# Patient Record
Sex: Male | Born: 1935 | Race: Black or African American | Hispanic: No | Marital: Married | Smoking: Former smoker
Health system: Southern US, Community
[De-identification: ages and names within clinical notes are randomized; demographics above are authoritative.]

## PROBLEM LIST (undated history)

## (undated) DIAGNOSIS — I739 Peripheral vascular disease, unspecified: Secondary | ICD-10-CM

## (undated) DIAGNOSIS — D649 Anemia, unspecified: Secondary | ICD-10-CM

## (undated) DIAGNOSIS — D696 Thrombocytopenia, unspecified: Secondary | ICD-10-CM

## (undated) DIAGNOSIS — I10 Essential (primary) hypertension: Secondary | ICD-10-CM

## (undated) DIAGNOSIS — E119 Type 2 diabetes mellitus without complications: Secondary | ICD-10-CM

## (undated) DIAGNOSIS — E7800 Pure hypercholesterolemia, unspecified: Secondary | ICD-10-CM

## (undated) DIAGNOSIS — K219 Gastro-esophageal reflux disease without esophagitis: Secondary | ICD-10-CM

## (undated) HISTORY — DX: Anemia, unspecified: D64.9

## (undated) HISTORY — PX: OTHER SURGICAL HISTORY: SHX169

## (undated) HISTORY — DX: Type 2 diabetes mellitus without complications: E11.9

## (undated) HISTORY — PX: COLONOSCOPY: SHX174

---

## 2003-07-15 ENCOUNTER — Other Ambulatory Visit: Payer: Self-pay

## 2004-06-26 ENCOUNTER — Ambulatory Visit: Payer: Self-pay | Admitting: General Surgery

## 2005-02-06 ENCOUNTER — Other Ambulatory Visit: Payer: Self-pay

## 2005-02-09 ENCOUNTER — Ambulatory Visit: Payer: Self-pay | Admitting: Orthopedic Surgery

## 2006-04-20 ENCOUNTER — Emergency Department: Payer: Self-pay | Admitting: Emergency Medicine

## 2006-09-12 ENCOUNTER — Ambulatory Visit: Payer: Self-pay | Admitting: Internal Medicine

## 2008-02-13 ENCOUNTER — Ambulatory Visit: Payer: Self-pay | Admitting: Orthopedic Surgery

## 2009-07-25 ENCOUNTER — Ambulatory Visit: Payer: Self-pay | Admitting: Internal Medicine

## 2010-01-12 ENCOUNTER — Ambulatory Visit: Payer: Self-pay | Admitting: Internal Medicine

## 2011-07-26 ENCOUNTER — Inpatient Hospital Stay: Payer: Self-pay | Admitting: Internal Medicine

## 2011-07-26 LAB — CBC
HCT: 37.5 % — ABNORMAL LOW (ref 40.0–52.0)
HGB: 12.3 g/dL — ABNORMAL LOW (ref 13.0–18.0)
MCV: 92 fL (ref 80–100)
RBC: 4.06 10*6/uL — ABNORMAL LOW (ref 4.40–5.90)
WBC: 4.5 10*3/uL (ref 3.8–10.6)

## 2011-07-26 LAB — COMPREHENSIVE METABOLIC PANEL
Alkaline Phosphatase: 94 U/L (ref 50–136)
Anion Gap: 8 (ref 7–16)
BUN: 18 mg/dL (ref 7–18)
Bilirubin,Total: 0.4 mg/dL (ref 0.2–1.0)
Co2: 25 mmol/L (ref 21–32)
Creatinine: 1.22 mg/dL (ref 0.60–1.30)
EGFR (Non-African Amer.): 56 — ABNORMAL LOW
Potassium: 4.5 mmol/L (ref 3.5–5.1)
SGPT (ALT): 30 U/L
Sodium: 141 mmol/L (ref 136–145)

## 2011-07-27 LAB — TSH: Thyroid Stimulating Horm: 0.945 u[IU]/mL

## 2013-04-29 ENCOUNTER — Ambulatory Visit: Payer: Self-pay | Admitting: Internal Medicine

## 2013-10-01 ENCOUNTER — Ambulatory Visit: Payer: Self-pay | Admitting: Internal Medicine

## 2014-04-27 NOTE — Discharge Summary (Signed)
PATIENT NAME:  Scott Hudson, Scott Hudson MR#:  [id number] DATE OF BIRTH:  12/31/1929  DATE OF ADMISSION:  07/26/2011 DATE OF DISCHARGE:  07/27/2011  ADMITTING DIAGNOSIS: Angioedema.   DISCHARGE DIAGNOSES: 1. Angioedema, likely due to lisinopril.  2. Sinus bradycardia. 3. History of hypertension. 4. Hyperlipidemia. 5. Gastroesophageal reflux disease.   DISCHARGE CONDITION: Fair.   DISCHARGE MEDICATIONS: Patient is to resume his outpatient medications which are: 1. Domeboro topical powder for constitution apply topically to affected area 3 times daily.  2. Omeprazole 40 mg p.o. daily.  3. Simvastatin 40 mg at bedtime.   ADDITIONAL MEDICATIONS:  1. Claritin 10 mg p.o. daily. 2. Benadryl 25 mg p.o. every four hours as needed.  3. Prednisone 60 mg p.o. once on 07/28/2011, then taper x 10 mg daily until stopped.  4. Hydralazine 10 mg p.o. 3 times daily. 5. Imdur 30 mg p.o. daily.  6. Patient was advised not to take lisinopril.  HOME OXYGEN: None.   DIET: 2 grams salt, low fat, low cholesterol.  ACTIVITY LIMITATIONS: As tolerated.   FOLLOW UP: Follow-up appointment with Dr. Hall Busing in two days after discharge.    CONSULTANTS: None.   HISTORY OF PRESENT ILLNESS: Patient is a 81 year old African American male with past medical history significant for history of hypertension, hyperlipidemia, gastroesophageal reflux disease who presented to the hospital with upper lip swelling. Please refer to Dr. Graciela Husbands admission note on 07/27/2011. Apparently patient woke up with tingling and numbness sensation in his lips and some mild swelling. Upon presentation to ED patient's lips, mainly upper lip, got progressively much worse despite IV Solu-Medrol as well as Zantac and Benadryl and hospitalist services were admitted for further management and observation. Otherwise patient did not complain of shortness of breath, any other symptoms of swallowing problems or airway occlusion. On arrival to the hospital  patient's temperature 97.8, pulse 48, respiration rate 16, blood pressure 166/82, saturation 98% on room air. Physical examination was unremarkable except of some swelling in his upper lip.  LABORATORY, DIAGNOSTIC AND RADIOLOGICAL DATA: Chest x-ray PA and lateral 07/26/2011 revealed mildly increased interstitial markings noted which could reflect mild interstitial edema. No focal pneumonia. Elevation of left hemidiaphragm was again demonstrated but was described on the chest x-ray as far as 06/03/1998.   Patient's lab data showed normal BMP. Patient's liver enzymes also were normal. Patient's CBC was within normal limits except patient's hemoglobin level was low at 12.3. White blood cell count 4.5 and platelet count 170. Chest x-ray was unremarkable.   HOSPITAL COURSE: Patient was admitted to the hospital for further evaluation and was started on Solu-Medrol as well as Claritin, Benadryl, Zantac. He improved slowly but his improvement was not significant, however, he continued to deny any other symptomatology such as airway occlusion or obstruction, any shortness of breath or discomfort in his mouth. It was felt he was stable to be discharged and he is being discharged today on 07/27/2011. On discharge, patient's vital signs: Temperature 98.3, pulse 66, respiration rate 18, blood pressure 147/65, saturation 94% to 97% on room air at rest. Of note, patient was noted to have sinus bradycardia. TSH was checked, was found to be within normal limits. Patient's bradycardia resolved and patient's heart rate is 60s to 70s on day of discharge. In regards to hypertension, as mentioned above patient's blood pressure medication, lisinopril, was completely discontinued. Patient was started on hydralazine as well as Imdur. It is recommended to advance hydralazine as well as Imdur or change to Norvasc and watch  patient's symptoms as well as blood pressure readings.   For hyperlipidemia, patient is to continue simvastatin.    For gastroesophageal reflux disease patient was continued on omeprazole.   Patient is being discharged in stable condition with above-mentioned medications and follow up.   TIME SPENT: 40 minutes.   ____________________________ Theodoro Grist, MD rv:cms D: 07/27/2011 17:42:05 ET T: 07/30/2011 09:30:05 ET JOB#: [id number]  cc: Theodoro Grist, MD, <Dictator> Leona Carry. Hall Busing, MD Theodoro Grist MD ELECTRONICALLY SIGNED 08/01/2011 16:23

## 2014-04-27 NOTE — H&P (Signed)
PATIENT NAME:  Scott Hudson, Scott Hudson MR#:  [id number] DATE OF BIRTH:  Apr 01, 1929  DATE OF ADMISSION:  07/26/2011  REFERRING PHYSICIAN: Francene Castle, MD  PRIMARY CARE PHYSICIAN: Benita Stabile, MD  CHIEF COMPLAINT: Lip swelling.   HISTORY OF PRESENT ILLNESS: This is a 81 year old male with significant past medical history of gastroesophageal reflux disease, hypertension, and hyperlipidemia who has been on lisinopril for the last three years without any problem. The patient reports this a.m. when he woke up he started to feel some tingling and numbness in his lips with some mild swelling. Upon presentation to the ED, the patient's lips, mainly upper lip, has gotten progressively much worse despite receiving IV Solu-Medrol and IV Zantac and Benadryl and there was no improvement so hospitalist service was requested to admit the patient for further management. The patient denies any shortness of breath, wheezing, any dysphagia, or any increased oral secretions or sputum production. He denies any chest pain, lightheadedness, altered mental status, or syncope.  PAST MEDICAL HISTORY:  1. Gastroesophageal reflux disease. 2. Hypertension.  3. Hyperlipidemia.   PAST SURGICAL HISTORY: History of bunion removal.   FAMILY HISTORY: No history of angioedema. Not significant for diabetes or hypertension.   ALLERGIES: No known drug allergies.   HOME MEDICATIONS:  1. Lisinopril 20 mg oral daily.  2. Omeprazole 40 mg oral daily.  3. Simvastatin 40 mg at bedtime.   SOCIAL HISTORY: The patient quit smoking a few years ago. No history of alcohol or illicit drug use.   REVIEW OF SYSTEMS: CONSTITUTIONAL: Denies any fever, fatigue, or weakness. EYES: Denies blurry vision, double vision, or pain. ENT: Denies tinnitus, ear pain, hearing loss, epistaxis, discharge, snoring, postnasal drip, or sinus pain. Has complaints of lip tingling and swelling. RESPIRATORY: Denies any cough, wheezing, hemoptysis, dyspnea, or asthma.  CARDIOVASCULAR: Denies any chest pain, orthopnea, or edema. GASTROINTESTINAL: Denies nausea, vomiting, diarrhea, abdominal pain, or hematemesis. GENITOURINARY: Denies dysuria, hematuria, or renal colic. ENDOCRINE: Denies polyuria, polydipsia, or heat or cold intolerance. HEMATOLOGY: Denies anemia, easy bruising, or bleeding diathesis. INTEGUMENT: Denies any acne or rash. MUSCULOSKELETAL: Denies any neck pain, shoulder pain, back pain, swelling, joint swelling, or cramps. NEUROLOGIC: Denies any weakness, dysarthria, epilepsy, or tremors. PSYCH: Denies any anxiety, insomnia, or schizophrenia.   PHYSICAL EXAMINATION:   VITAL SIGNS: Temperature 97.8, pulse 48, respiratory rate 16, blood pressure 166/82, and saturation 98% on room air.   GENERAL: Well-nourished male who is comfortable and in no apparent distress.   HEENT: Head atraumatic, normocephalic. Pupils are equal and reactive to light. Pink conjunctivae. Anicteric sclerae. Moist oral mucosa. Bilateral lip swelling, mainly upper lip. Tongue has no swelling. Oropharynx was examined. The patient does not have any swelling or erythema.   NECK: No thyromegaly, no bruits, supple.  LUNGS: Good air entry. No wheezing or rhonchi.   HEART: S1 and S2 heard. No murmurs, rubs, or gallops.  ABDOMEN: Soft, nontender, and nondistended. Bowel sounds present.   EXTREMITIES: No edema, clubbing, or cyanosis.   PSYCHIATRIC: Appropriate affect. Awake, alert, and oriented x3. Intact judgment and insight.   NEUROLOGIC: Cranial nerves grossly intact. Motor 5 out of 5. Sensory intact.   PERTINENT LABS: Glucose 99, BUN 18, creatinine 1.22, sodium 141, potassium 4.5, chloride 108, and CO2 25. White blood cells 4.5, hemoglobin 12.3, hematocrit 37.5, and platelets 170.   ASSESSMENT AND PLAN:  1. Angioedema. This is most likely secondary to lisinopril. We will add ACE and ARB to the patient's allergy list. We will admit him for  observation. We will start the patient  on IV Solu-Medrol 60 mg every eight hours and we will have him on Protonix for GI prophylaxis. As well we will start him on ranitidine and p.r.n. Benadryl. 2. Hypertension. We will hold lisinopril at this point and will resume a different class of medication, possibly Norvasc or beta blocker if blood pressure is uncontrolled. 3. Gastroesophageal reflux disease. We will continue with Protonix.  4. Deep vein thrombosis prophylaxis. Subcutaneous heparin.   CODE STATUS: FULL CODE.   TIME SPENT ON PATIENT CARE ON ADMISSION: 45 minutes.  ____________________________ Albertine Patricia, MD dse:slb D: 07/26/2011 22:32:08 ET T: 07/27/2011 08:45:55 ET JOB#: [id number]  cc: Albertine Patricia, MD, <Dictator> Leona Carry. Hall Busing, MD Demarqus Jocson Graciela Husbands MD ELECTRONICALLY SIGNED 07/27/2011 13:27

## 2014-06-23 ENCOUNTER — Emergency Department
Admission: EM | Admit: 2014-06-23 | Discharge: 2014-06-24 | Disposition: A | Discharge status: Home / Self Care | Payer: Worker's Compensation | Attending: Emergency Medicine | Admitting: Emergency Medicine

## 2014-06-23 ENCOUNTER — Encounter: Payer: Self-pay | Admitting: Emergency Medicine

## 2014-06-23 DIAGNOSIS — I10 Essential (primary) hypertension: Secondary | ICD-10-CM | POA: Diagnosis not present

## 2014-06-23 DIAGNOSIS — Z23 Encounter for immunization: Secondary | ICD-10-CM | POA: Insufficient documentation

## 2014-06-23 DIAGNOSIS — Y9389 Activity, other specified: Secondary | ICD-10-CM | POA: Diagnosis not present

## 2014-06-23 DIAGNOSIS — Y990 Civilian activity done for income or pay: Secondary | ICD-10-CM | POA: Insufficient documentation

## 2014-06-23 DIAGNOSIS — W260XXA Contact with knife, initial encounter: Secondary | ICD-10-CM | POA: Insufficient documentation

## 2014-06-23 DIAGNOSIS — Y9289 Other specified places as the place of occurrence of the external cause: Secondary | ICD-10-CM | POA: Diagnosis not present

## 2014-06-23 DIAGNOSIS — S61512A Laceration without foreign body of left wrist, initial encounter: Secondary | ICD-10-CM | POA: Diagnosis not present

## 2014-06-23 HISTORY — DX: Essential (primary) hypertension: I10

## 2014-06-23 MED ORDER — LIDOCAINE HCL (PF) 1 % IJ SOLN
INTRAMUSCULAR | Status: AC
  Filled 2014-06-23: qty 5

## 2014-06-23 NOTE — ED Provider Notes (Signed)
[facility] Emergency Department Provider Note  ____________________________________________  Time seen: 11:20 PM  I have reviewed the triage vital signs and the nursing notes.   HISTORY  Chief Complaint Laceration      HPI Scott Hudson is a 81 y.o. male presents with accidental laceration to the /wrist. Injury occurred at work apparently patient states he moved his arm and actually it rubbed against a knife. Patient unsure as to when he received his last tetanus shot.     Past Medical History  Diagnosis Date  . Hypertension     There are no active problems to display for this patient.   History reviewed. No pertinent past surgical history.  No current outpatient prescriptions on file.  Allergies Review of patient's allergies indicates not on file.  History reviewed. No pertinent family history.  Social History History  Substance Use Topics  . Smoking status: Never Smoker   . Smokeless tobacco: Not on file  . Alcohol Use: No    Review of Systems  Constitutional: Negative for fever. Eyes: Negative for visual changes. ENT: Negative for sore throat. Cardiovascular: Negative for chest pain. Respiratory: Negative for shortness of breath. Gastrointestinal: Negative for abdominal pain, vomiting and diarrhea. Genitourinary: Negative for dysuria. Musculoskeletal: Negative for back pain. Skin: Positive the laceration of the left wrist Neurological: Negative for headaches, focal weakness or numbness.   10-point ROS otherwise negative.  ____________________________________________   PHYSICAL EXAM:  VITAL SIGNS: ED Triage Vitals  Enc Vitals Group     BP 06/23/14 2323 166/81 mmHg     Pulse Rate 06/23/14 2323 73     Resp --      Temp 06/23/14 2323 97.7 F (36.5 C)     Temp Source 06/23/14 2323 Oral     SpO2 06/23/14 2323 96 %     Weight 06/23/14 2323 160 lb (72.576 kg)     Height 06/23/14 2323 5\' 8"  (1.727 m)     Head Cir  --      Peak Flow --      Pain Score --      Pain Loc --      Pain Edu? --      Excl. in [place]? --      Constitutional: Alert and oriented. Well appearing and in no distress. Eyes: Conjunctivae are normal. PERRL. Normal extraocular movements. ENT   Head: Normocephalic and atraumatic.   Nose: No congestion/rhinnorhea.   Mouth/Throat: Mucous membranes are moist.   Neck: No stridor. Hematological/Lymphatic/Immunilogical: No cervical lymphadenopathy. Cardiovascular: Normal rate, regular rhythm. Normal and symmetric distal pulses are present in all extremities. No murmurs, rubs, or gallops. Respiratory: Normal respiratory effort without tachypnea nor retractions. Breath sounds are clear and equal bilaterally. No wheezes/rales/rhonchi. Gastrointestinal: Soft and nontender. No distention. There is no CVA tenderness. Genitourinary: deferred Musculoskeletal: Nontender with normal range of motion in all extremities. No joint effusions.  No lower extremity tenderness nor edema. Neurologic:  Normal speech and language. No gross focal neurologic deficits are appreciated. Speech is normal.  Skin:  3 cm linear laceration to left wrist Psychiatric: Mood and affect are normal. Speech and behavior are normal. Patient exhibits appropriate insight and judgment.  ____________________________________________     PROCEDURES  Procedure(s) performed:  LACERATION REPAIR Performed by: Gregor Hams Authorized by: Gregor Hams Consent: Verbal consent obtained. Risks and benefits: risks, benefits and alternatives were discussed Consent given by: patient Patient identity confirmed: provided demographic data Prepped and Draped in normal sterile fashion  Wound explored  Laceration Location: Left wrist  Laceration Length: 4cm  No Foreign Bodies seen or palpated  Anesthesia: local infiltration  Local anesthetic: lidocaine 1%  Anesthetic total: 3 ml  Irrigation method:  syringe Amount of cleaning: standard  Skin closure: Sutured Number of sutures: 7 Technique: Simple interrupted   Patient tolerance: Patient tolerated the procedure well with no immediate complications.    ____________________________________________   INITIAL IMPRESSION / ASSESSMENT AND PLAN / ED COURSE  Pertinent labs & imaging results that were available during my care of the patient were reviewed by me and considered in my medical decision making (see chart for details).    ____________________________________________   FINAL CLINICAL IMPRESSION(S) / ED DIAGNOSES  Final diagnoses:  Wrist laceration, left, initial encounter      Gregor Hams, MD 06/24/14 [phone]

## 2014-06-23 NOTE — ED Notes (Signed)
Pt was at work at [employer] and he ran his arm across a knife. He has a laceration on his left wrist that was rewrapped by EMS. Pt alert & oriented, no pain reported. Pt able to move all fingers on left hand with no difficulty or pain.

## 2014-06-24 MED ORDER — BACITRACIN ZINC 500 UNIT/GM EX OINT
TOPICAL_OINTMENT | CUTANEOUS | Status: AC
  Filled 2014-06-24: qty 0.9

## 2014-06-24 MED ORDER — TETANUS-DIPHTHERIA TOXOIDS TD 5-2 LFU IM INJ
0.5000 mL | INJECTION | Freq: Once | INTRAMUSCULAR | Status: AC
  Administered 2014-06-24: 0.5 mL via INTRAMUSCULAR
  Filled 2014-06-24: qty 0.5

## 2014-06-24 NOTE — ED Notes (Signed)
WC completed and delivered to lab for carrie pick up.

## 2014-06-24 NOTE — ED Notes (Signed)
Pt waiting to give urine sample for WC.

## 2014-06-24 NOTE — Discharge Instructions (Signed)

## 2014-06-30 ENCOUNTER — Emergency Department
Admission: EM | Admit: 2014-06-30 | Discharge: 2014-06-30 | Disposition: A | Discharge status: Home / Self Care | Payer: Worker's Compensation | Attending: Emergency Medicine | Admitting: Emergency Medicine

## 2014-06-30 DIAGNOSIS — I10 Essential (primary) hypertension: Secondary | ICD-10-CM | POA: Insufficient documentation

## 2014-06-30 DIAGNOSIS — Z4802 Encounter for removal of sutures: Secondary | ICD-10-CM | POA: Insufficient documentation

## 2014-06-30 NOTE — ED Notes (Signed)
Patient here for suture removal, left arm

## 2014-06-30 NOTE — ED Provider Notes (Signed)
[facility] Emergency Department Provider Note  ____________________________________________  Time seen:  10:31 AM  I have reviewed the triage vital signs and the nursing notes.   HISTORY  Chief Complaint Suture / Staple Removal   HPI Scott Hudson is a 81 y.o. male is here today for suture removal of his left arm. He denies any pain at this time. He denies having any problems with his arm during the time of sutures of been in.   Past Medical History  Diagnosis Date  . Hypertension     There are no active problems to display for this patient.   No past surgical history on file.  No current outpatient prescriptions on file.  Allergies Review of patient's allergies indicates not on file.  No family history on file.  Social History History  Substance Use Topics  . Smoking status: Never Smoker   . Smokeless tobacco: Not on file  . Alcohol Use: No    Review of Systems Constitutional: No fever/chills ENT: No sore throat. Cardiovascular: Denies chest pain. Respiratory: Denies shortness of breath. Gastrointestinal: No abdominal pain.  No nausea, no vomiting. Musculoskeletal: Negative for back pain. Skin: Negative for rash. Neurological: Negative for headaches, focal weakness or numbness.  10-point ROS otherwise negative.  ____________________________________________   PHYSICAL EXAM:  VITAL SIGNS: ED Triage Vitals  Enc Vitals Group     BP 06/30/14 0949 135/58 mmHg     Pulse Rate 06/30/14 0949 71     Resp 06/30/14 0949 17     Temp 06/30/14 0949 97.8 F (36.6 C)     Temp Source 06/30/14 0949 Oral     SpO2 06/30/14 0949 93 %     Weight 06/30/14 0949 160 lb (72.576 kg)     Height 06/30/14 0949 5\' 8"  (1.727 m)     Head Cir --      Peak Flow --      Pain Score --      Pain Loc --      Pain Edu? --      Excl. in [place]? --     Constitutional: Alert and oriented. Well appearing and in no acute distress. Eyes: Conjunctivae are  normal. PERRL. EOMI. Head: Atraumatic. Nose: No congestion/rhinnorhea. Neck: No stridor. Cardiovascular: Normal rate, regular rhythm. Grossly normal heart sounds.  Good peripheral circulation. Respiratory: Normal respiratory effort.  No retractions. Lungs CTAB. Gastrointestinal: Soft and nontender. No distention. No abdominal bruits. No CVA tenderness. Musculoskeletal: No lower extremity tenderness nor edema.  No joint effusions. Neurologic:  Normal speech and language. No gross focal neurologic deficits are appreciated. Speech is normal. No gait instability. Skin:  Skin is warm, dry.  There are aspect left wrist no signs of infection. Skin is intact. Psychiatric: Mood and affect are normal. Speech and behavior are normal.  ____________________________________________   LABS (all labs ordered are listed, but only abnormal results are displayed)  Labs Reviewed - No data to display  PROCEDURES  Procedure(s) performed: None  Critical Care performed: No  ____________________________________________   INITIAL IMPRESSION / ASSESSMENT AND PLAN / ED COURSE  Pertinent labs & imaging results that were available during my care of the patient were reviewed by me and considered in my medical decision making (see chart for details).  Sutures were removed, a was reinforced with Steri-Strips. No infection was noted. Patient is return if any urgent concerns. ____________________________________________   FINAL CLINICAL IMPRESSION(S) / ED DIAGNOSES  Final diagnoses:  Encounter for removal of sutures  Johnn Hai, PA-C 06/30/14 1216  Lisa Roca, MD 06/30/14 [phone]

## 2014-06-30 NOTE — Discharge Instructions (Signed)

## 2014-11-01 ENCOUNTER — Other Ambulatory Visit: Payer: Self-pay | Admitting: Vascular Surgery

## 2014-11-29 ENCOUNTER — Other Ambulatory Visit
Admission: RE | Admit: 2014-11-29 | Discharge: 2014-11-29 | Disposition: A | Discharge status: Home / Self Care | Payer: Commercial Managed Care - PPO | Source: Ambulatory Visit | Attending: Vascular Surgery | Admitting: Vascular Surgery

## 2014-11-29 DIAGNOSIS — Z029 Encounter for administrative examinations, unspecified: Secondary | ICD-10-CM | POA: Diagnosis present

## 2014-11-29 LAB — CREATININE, SERUM
Creatinine, Ser: 1.2 mg/dL (ref 0.61–1.24)
GFR calc non Af Amer: 54 mL/min — ABNORMAL LOW (ref >60)

## 2014-11-29 LAB — BUN: BUN: 15 mg/dL (ref 6–20)

## 2014-11-30 ENCOUNTER — Encounter: Payer: Self-pay | Admitting: *Deleted

## 2014-11-30 ENCOUNTER — Encounter
Admission: RE | Disposition: A | Discharge status: Home / Self Care | Payer: Self-pay | Source: Ambulatory Visit | Attending: Vascular Surgery

## 2014-11-30 ENCOUNTER — Ambulatory Visit
Admission: RE | Admit: 2014-11-30 | Discharge: 2014-11-30 | Disposition: A | Discharge status: Home / Self Care | Payer: Commercial Managed Care - PPO | Source: Ambulatory Visit | Attending: Vascular Surgery | Admitting: Vascular Surgery

## 2014-11-30 DIAGNOSIS — I70223 Atherosclerosis of native arteries of extremities with rest pain, bilateral legs: Secondary | ICD-10-CM | POA: Diagnosis present

## 2014-11-30 DIAGNOSIS — E785 Hyperlipidemia, unspecified: Secondary | ICD-10-CM | POA: Diagnosis not present

## 2014-11-30 DIAGNOSIS — I10 Essential (primary) hypertension: Secondary | ICD-10-CM | POA: Insufficient documentation

## 2014-11-30 DIAGNOSIS — Z87891 Personal history of nicotine dependence: Secondary | ICD-10-CM | POA: Insufficient documentation

## 2014-11-30 DIAGNOSIS — Z79899 Other long term (current) drug therapy: Secondary | ICD-10-CM | POA: Diagnosis not present

## 2014-11-30 HISTORY — DX: Pure hypercholesterolemia, unspecified: E78.00

## 2014-11-30 HISTORY — DX: Gastro-esophageal reflux disease without esophagitis: K21.9

## 2014-11-30 HISTORY — DX: Peripheral vascular disease, unspecified: I73.9

## 2014-11-30 HISTORY — PX: PERIPHERAL VASCULAR CATHETERIZATION: SHX172C

## 2014-11-30 SURGERY — LOWER EXTREMITY INTERVENTION
Laterality: Right | Wound class: Clean

## 2014-11-30 MED ORDER — FENTANYL CITRATE (PF) 100 MCG/2ML IJ SOLN
INTRAMUSCULAR | Status: AC
  Filled 2014-11-30: qty 2

## 2014-11-30 MED ORDER — FENTANYL CITRATE (PF) 100 MCG/2ML IJ SOLN
INTRAMUSCULAR | Status: DC | PRN
  Administered 2014-11-30 (×2): 50 ug via INTRAVENOUS

## 2014-11-30 MED ORDER — LIDOCAINE HCL (PF) 1 % IJ SOLN
INTRAMUSCULAR | Status: AC
  Filled 2014-11-30: qty 10

## 2014-11-30 MED ORDER — HEPARIN SODIUM (PORCINE) 1000 UNIT/ML IJ SOLN
INTRAMUSCULAR | Status: AC
  Filled 2014-11-30: qty 1

## 2014-11-30 MED ORDER — CLOPIDOGREL BISULFATE 75 MG PO TABS
ORAL_TABLET | ORAL | Status: AC
  Filled 2014-11-30: qty 4

## 2014-11-30 MED ORDER — IOHEXOL 300 MG/ML  SOLN
INTRAMUSCULAR | Status: DC | PRN
  Administered 2014-11-30: 95 mL via INTRA_ARTERIAL

## 2014-11-30 MED ORDER — CLOPIDOGREL BISULFATE 75 MG PO TABS
300.0000 mg | ORAL_TABLET | Freq: Once | ORAL | Status: AC
  Administered 2014-11-30: 300 mg via ORAL

## 2014-11-30 MED ORDER — OXYCODONE HCL 5 MG PO TABS: 5.0000 mg | ORAL_TABLET | ORAL | Status: DC | PRN

## 2014-11-30 MED ORDER — ACETAMINOPHEN 325 MG RE SUPP: 325.0000 mg | RECTAL | Status: DC | PRN

## 2014-11-30 MED ORDER — SODIUM CHLORIDE 0.9 % IV SOLN
INTRAVENOUS | Status: DC
  Administered 2014-11-30 (×2): via INTRAVENOUS

## 2014-11-30 MED ORDER — ONDANSETRON HCL 4 MG/2ML IJ SOLN: 4.0000 mg | Freq: Four times a day (QID) | INTRAMUSCULAR | Status: DC | PRN

## 2014-11-30 MED ORDER — MIDAZOLAM HCL 5 MG/5ML IJ SOLN
INTRAMUSCULAR | Status: AC
  Filled 2014-11-30: qty 5

## 2014-11-30 MED ORDER — HEPARIN (PORCINE) IN NACL 2-0.9 UNIT/ML-% IJ SOLN
INTRAMUSCULAR | Status: AC
  Filled 2014-11-30: qty 1000

## 2014-11-30 MED ORDER — CEFUROXIME SODIUM 1.5 G IJ SOLR
1.5000 g | INTRAMUSCULAR | Status: AC
  Administered 2014-11-30: 1.5 g via INTRAVENOUS
  Filled 2014-11-30: qty 1.5

## 2014-11-30 MED ORDER — ACETAMINOPHEN 325 MG PO TABS: 325.0000 mg | ORAL_TABLET | ORAL | Status: DC | PRN

## 2014-11-30 MED ORDER — ALUM & MAG HYDROXIDE-SIMETH 200-200-20 MG/5ML PO SUSP
15.0000 mL | ORAL | Status: DC | PRN
Start: 2014-11-30 — End: 2014-11-30

## 2014-11-30 MED ORDER — HEPARIN (PORCINE) IN NACL 2-0.9 UNIT/ML-% IJ SOLN
INTRAMUSCULAR | Status: AC
  Filled 2014-11-30: qty 500

## 2014-11-30 MED ORDER — LIDOCAINE HCL (PF) 1 % IJ SOLN
INTRAMUSCULAR | Status: DC | PRN
  Administered 2014-11-30: 5 mL via INTRADERMAL

## 2014-11-30 MED ORDER — HYDROMORPHONE HCL 1 MG/ML IJ SOLN: 0.5000 mg | INTRAMUSCULAR | Status: DC | PRN

## 2014-11-30 MED ORDER — CLOPIDOGREL BISULFATE 75 MG PO TABS: 75.0000 mg | ORAL_TABLET | Freq: Every day | ORAL | Status: DC

## 2014-11-30 MED ORDER — HEPARIN SODIUM (PORCINE) 1000 UNIT/ML IJ SOLN
INTRAMUSCULAR | Status: DC | PRN
  Administered 2014-11-30: 5000 [IU] via INTRAVENOUS

## 2014-11-30 MED ORDER — MIDAZOLAM HCL 2 MG/2ML IJ SOLN
INTRAMUSCULAR | Status: DC | PRN
  Administered 2014-11-30: 2 mg via INTRAVENOUS
  Administered 2014-11-30: 1 mg via INTRAVENOUS

## 2014-11-30 SURGICAL SUPPLY — 23 items
BALLN DORADO 5X200X135 (BALLOONS) ×5
BALLN LUTONIX 6X150X130 (BALLOONS) ×10
BALLN LUTONIX DCB 6X100X130 (BALLOONS) ×5
BALLN ULTRVRSE 6X200X130 (BALLOONS) ×5
BALLN ULTRVRSE 7X40X130C (BALLOONS) ×5
BALLOON DORADO 5X200X135 (BALLOONS) ×3 IMPLANT
BALLOON LUTONIX 6X150X130 (BALLOONS) ×6 IMPLANT
BALLOON LUTONIX DCB 6X100X130 (BALLOONS) ×3 IMPLANT
BALLOON ULTRVRSE 6X200X130 (BALLOONS) ×3 IMPLANT
BALLOON ULTRVRSE 7X40X130C (BALLOONS) ×3 IMPLANT
CATH PIG 70CM (CATHETERS) ×5 IMPLANT
CATH VERT 100CM (CATHETERS) ×10 IMPLANT
DEVICE PRESTO INFLATION (MISCELLANEOUS) ×5 IMPLANT
DEVICE STARCLOSE SE CLOSURE (Vascular Products) ×5 IMPLANT
GLIDEWIRE ANGLED SS 035X260CM (WIRE) ×5 IMPLANT
LIFESTENT SOLO 7X200X135 (Permanent Stent) ×5 IMPLANT
PACK ANGIOGRAPHY (CUSTOM PROCEDURE TRAY) ×5 IMPLANT
SHEATH BRITE TIP 5FRX11 (SHEATH) ×5 IMPLANT
SHEATH RAABE 6FR (SHEATH) ×5 IMPLANT
SYR MEDRAD MARK V 150ML (SYRINGE) ×5 IMPLANT
TUBING CONTRAST HIGH PRESS 72 (TUBING) ×5 IMPLANT
WIRE J 3MM .035X145CM (WIRE) ×5 IMPLANT
WIRE MAGIC TORQUE 260C (WIRE) ×5 IMPLANT

## 2014-11-30 NOTE — Discharge Instructions (Signed)
Angiogram, Care After °Refer to this sheet in the next few weeks. These instructions provide you with information about caring for yourself after your procedure. Your health care provider may also give you more specific instructions. Your treatment has been planned according to current medical practices, but problems sometimes occur. Call your health care provider if you have any problems or questions after your procedure. °WHAT TO EXPECT AFTER THE PROCEDURE °After your procedure, it is typical to have the following: °· Bruising at the catheter insertion site that usually fades within 1-2 weeks. °· Blood collecting in the tissue (hematoma) that may be painful to the touch. It should usually decrease in size and tenderness within 1-2 weeks. °HOME CARE INSTRUCTIONS °· Take medicines only as directed by your health care provider. °· You may shower 24-48 hours after the procedure or as directed by your health care provider. Remove the bandage (dressing) and gently wash the site with plain soap and water. Pat the area dry with a clean towel. Do not rub the site, because this may cause bleeding. °· Do not take baths, swim, or use a hot tub until your health care provider approves. °· Check your insertion site every day for redness, swelling, or drainage. °· Do not apply powder or lotion to the site. °· Do not lift over 10 lb (4.5 kg) for 5 days after your procedure or as directed by your health care provider. °· Ask your health care provider when it is okay to: °¨ Return to work or school. °¨ Resume usual physical activities or sports. °¨ Resume sexual activity. °· Do not drive home if you are discharged the same day as the procedure. Have someone else drive you. °· You may drive 24 hours after the procedure unless otherwise instructed by your health care provider. °· Do not operate machinery or power tools for 24 hours after the procedure or as directed by your health care provider. °· If your procedure was done as an  outpatient procedure, which means that you went home the same day as your procedure, a responsible adult should be with you for the first 24 hours after you arrive home. °· Keep all follow-up visits as directed by your health care provider. This is important. °SEEK MEDICAL CARE IF: °· You have a fever. °· You have chills. °· You have increased bleeding from the catheter insertion site. Hold pressure on the site. °SEEK IMMEDIATE MEDICAL CARE IF: °· You have unusual pain at the catheter insertion site. °· You have redness, warmth, or swelling at the catheter insertion site. °· You have drainage (other than a small amount of blood on the dressing) from the catheter insertion site. °· The catheter insertion site is bleeding, and the bleeding does not stop after 30 minutes of holding steady pressure on the site. °· The area near or just beyond the catheter insertion site becomes pale, cool, tingly, or numb. °  °This information is not intended to replace advice given to you by your health care provider. Make sure you discuss any questions you have with your health care provider. °  °Document Released: 07/13/2004 Document Revised: 01/15/2014 Document Reviewed: 05/28/2012 °Elsevier Interactive Patient Education ©2016 [employer] ° °

## 2014-11-30 NOTE — Progress Notes (Signed)
Pt doing well post procedure, no bleeding nor hematoma at left groin, wife present, discharge teaching given with return appt. Questions answered,

## 2014-11-30 NOTE — H&P (Signed)
[facility] History & Physical Update  The patient was interviewed and re-examined.  The patient's previous History and Physical has been reviewed and is unchanged.  There is no change in the plan of care. We plan to proceed with the scheduled procedure.  Kahle Mcqueen, Dolores Lory, MD  11/30/2014, 8:31 AM

## 2014-11-30 NOTE — Op Note (Signed)
Scott Hudson Percutaneous Study/Intervention Procedural Note   Date of Surgery: 11/30/2014  Surgeon:  Katha Cabal, MD.  Pre-operative Diagnosis: Atherosclerotic occlusive disease bilateral lower extremities with rest pain right lower extremity  Post-operative diagnosis: Same  Procedure(s) Performed: 1. Introduction catheter into right lower extremity 3rd order catheter placement  2. Contrast injection right lower extremity for distal runoff   3. Percutaneous transluminal angioplasty and stent placement right superficial femoral artery and popliteal 4. Star close closure left common femoral arteriotomy  Anesthesia: Conscious sedation with IV Versed and fentanyl  Sheath: 6 French Rabi  Contrast: 95 cc  Fluoroscopy Time: 12.3 minutes  Indications: Scott Hudson presents with pain in the right lower extremity area did noninvasive studies as well as physical examination demonstrated severe atherosclerotic occlusive disease with symptoms consistent with rest pain and lifestyle limiting claudication. The risks and benefits are reviewed all questions answered patient agrees to proceed.  Procedure: Scott Hudson is a 81 y.o. y.o. male who was identified and appropriate procedural time out was performed. The patient was then placed supine on the table and prepped and draped in the usual sterile fashion.   Ultrasound was placed in the sterile sleeve and the left groin was evaluated the left common femoral artery was echolucent and pulsatile indicating patency.  Image was recorded for the permanent record and under real-time visualization a microneedle was inserted into the common femoral artery microwire followed by a micro-sheath.  A J-wire was then advanced through the micro-sheath and a  5 [place] sheath was then inserted over a J-wire. J-wire was then advanced and a 5 French pigtail catheter was  positioned at the level of T12. AP projection of the aorta was then obtained. Pigtail catheter was repositioned to above the bifurcation and a LAO view of the pelvis was obtained.  Subsequently a pigtail catheter with the stiff angle Glidewire was used to cross the aortic bifurcation the catheter wire were advanced down into the right distal external iliac artery. Oblique view of the femoral bifurcation was then obtained and subsequently the wire was reintroduced and the pigtail catheter negotiated into the SFA representing third order catheter placement. Distal runoff was then performed.  5000 units of heparin was then given and allowed to circulate and a 6 Fr Rabi sheath was advanced up and over the bifurcation and positioned in the femoral artery  KMP  catheter and stiff angle Glidewire were then negotiated down into the distal popliteal.  Distal runoff was then completed by hand injection through the catheter. The wire was then reintroduced and a 5 x 20 [place] balloon was used to angioplasty the superficial femoral and popliteal arteries. Inflations were to 14 atmospheres for 2 minutes. Follow-up imaging demonstrated patency with adequate preparation of the vessel for a drug-coated balloon.  Subsequently, a 6 x 15 Lutonix balloon (2 balloons were required to cover the distance in addition a 6 x 10 was also required all inflations were as described) was utilized inflating to 12 atm for 2 full minutes. Follow-up imaging demonstrated greater than 50% residual stenosis and therefore a 7 x 200 life stent was deployed and subsequently postdilated with a 6 mm ultra versed balloon one area of greater than 50% residual stenosis was then secondarily dilated using a 7 x 4 ultra versus with good result. Distal runoff was then reassessed.  After review of these images the sheath is pulled into the left external iliac oblique of the common femoral is obtained and  a Star close device deployed. There no immediate  Complications.  Findings: Initial views demonstrate diffuse disease throughout the entire aortoiliac and lower extremity system. There does not appear to be any flow limiting or hemodynamically significant lesions within the distal aorta or the iliac system.  The right common femoral is diffusely diseased but appears patent the profunda femoris is patent but there is a greater than 90% focal stenosis at the origin the SFA is patent in its proximal 5 or so centimeters and then demonstrates diffuse disease down to the mid popliteal with multiple focal areas of high-grade hemodynamically significant stenosis. There is heavy calcification noted throughout and his arteries are easily visible under FluoroScan evaluation without contrast area the distal popliteal appears widely patent and the tibioperoneal trunk peroneal and posterior tibial are patent down to the foot. Peroneal is the dominant artery to the foot with a large collateral that then fills the dorsalis pedis which fills the pedal arch. Anterior tibial occludes at its origin remains occluded throughout the entire course.  Following angioplasty to 6 mm there is diffuse high-grade residual stenosis in the midportion and a life stent is placed postdilated as described above with excellent result distal runoff is preserved.   Disposition: Patient was taken to the recovery room in stable condition having tolerated the procedure well.  Schnier, Dolores Lory 10/12/2014,3:14 PM

## 2015-06-13 ENCOUNTER — Ambulatory Visit
Admission: RE | Admit: 2015-06-13 | Discharge: 2015-06-13 | Disposition: A | Discharge status: Home / Self Care | Payer: Commercial Managed Care - PPO | Source: Ambulatory Visit | Attending: Internal Medicine | Admitting: Internal Medicine

## 2015-06-13 ENCOUNTER — Other Ambulatory Visit: Payer: Self-pay | Admitting: Internal Medicine

## 2015-06-13 DIAGNOSIS — R0600 Dyspnea, unspecified: Secondary | ICD-10-CM | POA: Insufficient documentation

## 2015-06-15 ENCOUNTER — Other Ambulatory Visit: Payer: Self-pay | Admitting: *Deleted

## 2015-06-15 DIAGNOSIS — D539 Nutritional anemia, unspecified: Secondary | ICD-10-CM

## 2015-06-15 DIAGNOSIS — D509 Iron deficiency anemia, unspecified: Secondary | ICD-10-CM

## 2015-06-15 NOTE — Progress Notes (Signed)
rcvd call from Dr. Sharlet Salina Tate's office. [phone] re: New referral.  hgb 6.2.  Dr. Hall Busing requesting pt to be set up for consult-urgent with hematology and transfusion of 2 units of blood. Spoke with Dr. Rogue Bussing.  Md agreed to see pt at 1030 on 06/16/15.  Md requested the following lab panels to be drawn upon arrival to cancer ctr.   Cbc, ferritin, ldh, hold tube, metc, b12 and folate levels. Lab orders entered in epic.  Called Dr. Juanell Fairly office back. Provided apt. Asked to arrive between 945-10am to check in at desk and complete NP paperwork.  Lab apt is at [facility] with MD at 1030am.  Will plan for 2 units of blood for Friday 06/17/15. No available infusion chair in cancer ctr on 06/16/15 for 2 units of blood.  I explained this to Dr. Sondra Come' office.  I also explained that if patient is severely symptomatic on arrival the other option is to transfuse in SDS dept or phase 3. I also explained that the hgb will be rechecked tomorrow. Dr. Rogue Bussing will examine the patient tomorrow to best determine when to transfuse.

## 2015-06-16 ENCOUNTER — Inpatient Hospital Stay: Payer: Commercial Managed Care - PPO | Attending: Internal Medicine | Admitting: Internal Medicine

## 2015-06-16 ENCOUNTER — Inpatient Hospital Stay: Payer: Commercial Managed Care - PPO

## 2015-06-16 ENCOUNTER — Other Ambulatory Visit: Payer: Self-pay | Admitting: *Deleted

## 2015-06-16 ENCOUNTER — Encounter: Payer: Self-pay | Admitting: Internal Medicine

## 2015-06-16 VITALS — BP 171/57 | HR 75 | Temp 96.4°F | Resp 19 | Ht 68.11 in | Wt 149.0 lb

## 2015-06-16 DIAGNOSIS — D649 Anemia, unspecified: Secondary | ICD-10-CM | POA: Diagnosis not present

## 2015-06-16 DIAGNOSIS — D509 Iron deficiency anemia, unspecified: Secondary | ICD-10-CM

## 2015-06-16 DIAGNOSIS — R634 Abnormal weight loss: Secondary | ICD-10-CM | POA: Diagnosis not present

## 2015-06-16 DIAGNOSIS — Z87891 Personal history of nicotine dependence: Secondary | ICD-10-CM

## 2015-06-16 DIAGNOSIS — I739 Peripheral vascular disease, unspecified: Secondary | ICD-10-CM | POA: Diagnosis not present

## 2015-06-16 DIAGNOSIS — Z79899 Other long term (current) drug therapy: Secondary | ICD-10-CM | POA: Diagnosis not present

## 2015-06-16 DIAGNOSIS — I10 Essential (primary) hypertension: Secondary | ICD-10-CM | POA: Diagnosis not present

## 2015-06-16 DIAGNOSIS — R5383 Other fatigue: Secondary | ICD-10-CM

## 2015-06-16 DIAGNOSIS — K219 Gastro-esophageal reflux disease without esophagitis: Secondary | ICD-10-CM | POA: Diagnosis not present

## 2015-06-16 DIAGNOSIS — D539 Nutritional anemia, unspecified: Secondary | ICD-10-CM

## 2015-06-16 DIAGNOSIS — R0602 Shortness of breath: Secondary | ICD-10-CM

## 2015-06-16 DIAGNOSIS — E7800 Pure hypercholesterolemia, unspecified: Secondary | ICD-10-CM | POA: Diagnosis not present

## 2015-06-16 LAB — CBC WITH DIFFERENTIAL/PLATELET
BASOS ABS: 0 10*3/uL (ref 0–0.1)
BASOS PCT: 1 %
EOS PCT: 2 %
Eosinophils Absolute: 0.1 10*3/uL (ref 0–0.7)
HEMATOCRIT: 21.6 % — AB (ref 40.0–52.0)
Hemoglobin: 6.5 g/dL — ABNORMAL LOW (ref 13.0–18.0)
Lymphocytes Relative: 20 %
Lymphs Abs: 1.2 10*3/uL (ref 1.0–3.6)
MCH: 19.8 pg — ABNORMAL LOW (ref 26.0–34.0)
MCHC: 30 g/dL — ABNORMAL LOW (ref 32.0–36.0)
MCV: 65.8 fL — ABNORMAL LOW (ref 80.0–100.0)
MONO ABS: 0.6 10*3/uL (ref 0.2–1.0)
Monocytes Relative: 11 %
NEUTROS ABS: 4.1 10*3/uL (ref 1.4–6.5)
Neutrophils Relative %: 66 %
PLATELETS: 184 10*3/uL (ref 150–440)
RBC: 3.29 MIL/uL — ABNORMAL LOW (ref 4.40–5.90)
RDW: 19.2 % — AB (ref 11.5–14.5)
WBC: 6.1 10*3/uL (ref 3.8–10.6)

## 2015-06-16 LAB — URINALYSIS COMPLETE WITH MICROSCOPIC (ARMC ONLY)
BILIRUBIN URINE: NEGATIVE
Glucose, UA: NEGATIVE mg/dL
HGB URINE DIPSTICK: NEGATIVE
KETONES UR: NEGATIVE mg/dL
Nitrite: NEGATIVE
PH: 5 (ref 5.0–8.0)
Protein, ur: NEGATIVE mg/dL
Specific Gravity, Urine: 1.013 (ref 1.005–1.030)

## 2015-06-16 LAB — COMPREHENSIVE METABOLIC PANEL
ALBUMIN: 4.3 g/dL (ref 3.5–5.0)
ALT: 22 U/L (ref 17–63)
AST: 31 U/L (ref 15–41)
Alkaline Phosphatase: 65 U/L (ref 38–126)
Anion gap: 5 (ref 5–15)
BUN: 19 mg/dL (ref 6–20)
CHLORIDE: 108 mmol/L (ref 101–111)
CO2: 24 mmol/L (ref 22–32)
Calcium: 9.5 mg/dL (ref 8.9–10.3)
Creatinine, Ser: 1.19 mg/dL (ref 0.61–1.24)
GFR calc Af Amer: >60 mL/min (ref >60)
GFR calc non Af Amer: 55 mL/min — ABNORMAL LOW (ref >60)
GLUCOSE: 161 mg/dL — AB (ref 65–99)
POTASSIUM: 4.6 mmol/L (ref 3.5–5.1)
Sodium: 137 mmol/L (ref 135–145)
Total Bilirubin: 0.5 mg/dL (ref 0.3–1.2)
Total Protein: 7.6 g/dL (ref 6.5–8.1)

## 2015-06-16 LAB — ABO/RH: ABO/RH(D): O NEG

## 2015-06-16 LAB — SAMPLE TO BLOOD BANK

## 2015-06-16 LAB — LACTATE DEHYDROGENASE: LDH: 181 U/L (ref 98–192)

## 2015-06-16 LAB — IRON AND TIBC
Iron: 20 ug/dL — ABNORMAL LOW (ref 45–182)
SATURATION RATIOS: 4 % — AB (ref 17.9–39.5)
TIBC: 499 ug/dL — AB (ref 250–450)
UIBC: 479 ug/dL

## 2015-06-16 LAB — VITAMIN B12: VITAMIN B 12: 540 pg/mL (ref 180–914)

## 2015-06-16 LAB — FOLATE: FOLATE: 45 ng/mL (ref >5.9)

## 2015-06-16 LAB — FERRITIN: Ferritin: 8 ng/mL — ABNORMAL LOW (ref 24–336)

## 2015-06-16 NOTE — Progress Notes (Signed)
New patient evaluation for anemia. Pt having new abdominal pains upper and lower abdomen. Reports bowels are moving normally. No blood or color change of stools. Pt has had an 8lb weight loss over 8 weeks. He feels dizzy at times. Feels weak and fatigued for last 4 weeks.. A/O x 3. Ambulatory.

## 2015-06-16 NOTE — Progress Notes (Signed)
[facility] NOTE  Patient Care Team: Albina Billet, MD as PCP - General (Internal Medicine)  CHIEF COMPLAINTS/PURPOSE OF CONSULTATION:   # ANEMIA MICROCYTIC-   HISTORY OF PRESENTING ILLNESS:  Scott Hudson 82 y.o.  male with prior history of peripheral vascular disease- is currently referred was for further evaluation of his severe anemia. He was noted to have a hemoglobin of 10.5/MCV in 60s on lab work through his PCP.   Patient states that he had been fatigued for the last few months. Also complains of shortness of breath with exertion. He denies any nausea or vomiting. Denies any significant abdominal pain. Denies any constipation or diarrhea. However had more than 10 pounds weight loss in the last 1-2 months. His appetite is okay.  Denies any blood in stools. Denies any black stools. Also denies any blood in urine. Remote history of smoking. Previous colonoscopy more than 10 years ago.  ROS: A complete 10 point review of system is done which is negative except mentioned above in history of present illness  MEDICAL HISTORY:  Past Medical History  Diagnosis Date  . Hypertension   . GERD (gastroesophageal reflux disease)   . Hypercholesteremia   . Peripheral vascular disease ([place])     SURGICAL HISTORY: Past Surgical History  Procedure Laterality Date  . Rt hand trama, tips of two fingers removed    . Peripheral vascular catheterization  11/30/2014    Procedure: Lower Extremity Intervention;  Surgeon: Katha Cabal, MD;  Location: [facility];  Service: Cardiovascular;;  . Peripheral vascular catheterization N/A 11/30/2014    Procedure: Abdominal Aortogram w/Lower Extremity;  Surgeon: Katha Cabal, MD;  Location: [facility];  Service: Cardiovascular;  Laterality: N/A;    SOCIAL HISTORY: Social History   Social History  . Marital Status: Married    Spouse Name: N/A  . Number of Children: N/A  . Years of Education: N/A    Occupational History  . Not on file.   Social History Main Topics  . Smoking status: Former Smoker -- 1.50 packs/day for 40 years    Types: Cigarettes    Quit date: 11/29/2013  . Smokeless tobacco: Not on file  . Alcohol Use: No  . Drug Use: No  . Sexual Activity: Not on file   Other Topics Concern  . Not on file   Social History Narrative    FAMILY HISTORY: No family history on file.  ALLERGIES:  has no allergies on file.  MEDICATIONS:  Current Outpatient Prescriptions  Medication Sig Dispense Refill  . isosorbide mononitrate (IMDUR) 30 MG 24 hr tablet Take 30 mg by mouth daily.    . metFORMIN (GLUCOPHAGE) 500 MG tablet Take 500 mg by mouth daily with breakfast.    . simvastatin (ZOCOR) 40 MG tablet Take 40 mg by mouth daily.    Marland Kitchen omeprazole (PRILOSEC) 40 MG capsule Take 40 mg by mouth daily. Reported on 06/16/2015     No current facility-administered medications for this visit.      Marland Kitchen  PHYSICAL EXAMINATION:   There were no vitals filed for this visit. Filed Weights   06/16/15 1021  Weight: 149 lb 0.5 oz (67.6 kg)    GENERAL: Thin built moderately nourished male patient Alert, no distress and comfortable.  Accompanied by his wife. EYES: Positive for pallor; no icterus OROPHARYNX: no thrush or ulceration; good dentition  NECK: supple, no masses felt LYMPH:  no palpable lymphadenopathy in the cervical, axillary  or inguinal regions LUNGS: clear to auscultation and  No wheeze or crackles HEART/CVS: regular rate & rhythm and no murmurs; No lower extremity edema ABDOMEN: abdomen soft, non-tender and normal bowel sounds Musculoskeletal:no cyanosis of digits and no clubbing  PSYCH: alert & oriented x 3 with fluent speech NEURO: no focal motor/sensory deficits SKIN:  no rashes or significant lesions  LABORATORY DATA:  I have reviewed the data as listed Lab Results  Component Value Date   WBC 6.1 06/16/2015   HGB 6.5* 06/16/2015   HCT 21.6* 06/16/2015   MCV  65.8* 06/16/2015   PLT 184 06/16/2015    Recent Labs  11/29/14 0838 06/16/15 0948  NA  --  137  K  --  4.6  CL  --  108  CO2  --  24  GLUCOSE  --  161*  BUN 15 19  CREATININE 1.20 1.19  CALCIUM  --  9.5  GFRNONAA 54* 55*  GFRAA >60 >60  PROT  --  7.6  ALBUMIN  --  4.3  AST  --  31  ALT  --  22  ALKPHOS  --  65  BILITOT  --  0.5    RADIOGRAPHIC STUDIES: I have personally reviewed the radiological images as listed and agreed with the findings in the report. Dg Chest 2 View  06/13/2015  CLINICAL DATA:  Shortness of breath. EXAM: CHEST  2 VIEW COMPARISON:  October 01, 2013 FINDINGS: There is elevation of the left hemidiaphragm. The heart size borderline. The hila and mediastinum are normal. No pulmonary nodules, masses, or focal infiltrates. Mild atelectasis in the left lung base. IMPRESSION: No active cardiopulmonary disease. Electronically Signed   By: Dorise Bullion III M.D   On: 06/13/2015 15:51    ASSESSMENT & PLAN:   # MICROCYTIC ANEMIA- hemoglobin 6.5 MCV 60s. Highly concerning for iron deficiency anemia. Iron studies pending. Recommend 2 units of PRBC transfusion as patient is very symptomatic. I would recommend IV Feraheme weekly 2. Also discussed getting GI evaluation with upper and lower endoscopy. Check s stool cards and urine analysis.   # WEIGHT LOSS- unintentional more than 10 pounds. With prior history of smoking- concerning for malignancy. Check CT abdomen pelvis. Recent chest x-ray negative.  # Patient follow-up with me in approximately 2 weeks/ second dose of IV iron. Discussed the infusion reactions;   All questions were answered. The patient knows to call the clinic with any problems, questions or concerns.  The above plan of care was discussed with the patient's primary care physician Dr. Hall Busing. He agrees.  # 30 minutes face-to-face with the patient discussing the above plan of care; more than 50% of time spent on counseling and coordination.       Cammie Sickle, MD 06/16/2015 10:36 AM

## 2015-06-16 NOTE — Progress Notes (Signed)
Blood bank orders released in preparation for 2 units tomorrow. hgb 6.3.  Spoke with [place] in blood bank to confirm that orders were received.

## 2015-06-17 ENCOUNTER — Inpatient Hospital Stay: Payer: Commercial Managed Care - PPO

## 2015-06-17 ENCOUNTER — Other Ambulatory Visit: Payer: Self-pay | Admitting: Internal Medicine

## 2015-06-17 VITALS — BP 146/65 | HR 75 | Resp 20

## 2015-06-17 DIAGNOSIS — D509 Iron deficiency anemia, unspecified: Secondary | ICD-10-CM

## 2015-06-17 DIAGNOSIS — D500 Iron deficiency anemia secondary to blood loss (chronic): Secondary | ICD-10-CM | POA: Insufficient documentation

## 2015-06-17 LAB — TYPE AND SCREEN
ABO/RH(D): O NEG
Antibody Screen: NEGATIVE
Unit division: 0
Unit division: 0

## 2015-06-17 LAB — PREPARE RBC (CROSSMATCH)

## 2015-06-17 MED ORDER — FERUMOXYTOL INJECTION 510 MG/17 ML
510.0000 mg | Freq: Once | INTRAVENOUS | Status: AC
  Administered 2015-06-17: 510 mg via INTRAVENOUS
  Filled 2015-06-17: qty 17

## 2015-06-17 MED ORDER — SODIUM CHLORIDE 0.9 % IV SOLN
Freq: Once | INTRAVENOUS | Status: AC
  Administered 2015-06-17: 10:00:00 via INTRAVENOUS
  Filled 2015-06-17: qty 1000

## 2015-06-17 NOTE — Progress Notes (Signed)
Pt accompanied to infusion area for blood transfusion, states never has had blood before, does not really want a transfusion, would rather try some other form of treatment, I went and spoke to Dr Gildardo Griffes, MD came to infusion and spoke with pt, pt will receive feraheme instead of transfusion if approved by insurance, pt in agreement with this plan.

## 2015-06-23 ENCOUNTER — Ambulatory Visit: Payer: Self-pay

## 2015-06-24 ENCOUNTER — Ambulatory Visit
Admission: RE | Admit: 2015-06-24 | Discharge: 2015-06-24 | Disposition: A | Discharge status: Home / Self Care | Payer: Commercial Managed Care - PPO | Source: Ambulatory Visit | Attending: Internal Medicine | Admitting: Internal Medicine

## 2015-06-24 DIAGNOSIS — D509 Iron deficiency anemia, unspecified: Secondary | ICD-10-CM | POA: Diagnosis present

## 2015-06-24 DIAGNOSIS — R634 Abnormal weight loss: Secondary | ICD-10-CM | POA: Insufficient documentation

## 2015-06-24 DIAGNOSIS — I2510 Atherosclerotic heart disease of native coronary artery without angina pectoris: Secondary | ICD-10-CM | POA: Insufficient documentation

## 2015-06-24 MED ORDER — IOPAMIDOL (ISOVUE-300) INJECTION 61%
85.0000 mL | Freq: Once | INTRAVENOUS | Status: AC | PRN
  Administered 2015-06-24: 85 mL via INTRAVENOUS

## 2015-06-29 DIAGNOSIS — D509 Iron deficiency anemia, unspecified: Secondary | ICD-10-CM | POA: Diagnosis not present

## 2015-06-30 ENCOUNTER — Other Ambulatory Visit: Payer: Self-pay | Admitting: Internal Medicine

## 2015-06-30 ENCOUNTER — Inpatient Hospital Stay (HOSPITAL_BASED_OUTPATIENT_CLINIC_OR_DEPARTMENT_OTHER): Payer: Commercial Managed Care - PPO | Admitting: Family Medicine

## 2015-06-30 ENCOUNTER — Ambulatory Visit: Payer: Self-pay

## 2015-06-30 ENCOUNTER — Inpatient Hospital Stay: Payer: Commercial Managed Care - PPO

## 2015-06-30 VITALS — BP 117/57 | HR 61 | Resp 18

## 2015-06-30 VITALS — BP 120/52 | HR 64 | Temp 98.2°F | Wt 141.2 lb

## 2015-06-30 DIAGNOSIS — D509 Iron deficiency anemia, unspecified: Secondary | ICD-10-CM | POA: Diagnosis not present

## 2015-06-30 DIAGNOSIS — Z87891 Personal history of nicotine dependence: Secondary | ICD-10-CM

## 2015-06-30 DIAGNOSIS — Z79899 Other long term (current) drug therapy: Secondary | ICD-10-CM

## 2015-06-30 DIAGNOSIS — R634 Abnormal weight loss: Secondary | ICD-10-CM

## 2015-06-30 DIAGNOSIS — R5383 Other fatigue: Secondary | ICD-10-CM

## 2015-06-30 LAB — CBC WITH DIFFERENTIAL/PLATELET
BASOS PCT: 1 %
Basophils Absolute: 0 10*3/uL (ref 0–0.1)
EOS ABS: 0 10*3/uL (ref 0–0.7)
Eosinophils Relative: 0 %
HCT: 26.7 % — ABNORMAL LOW (ref 40.0–52.0)
HEMOGLOBIN: 8.3 g/dL — AB (ref 13.0–18.0)
LYMPHS ABS: 1.4 10*3/uL (ref 1.0–3.6)
Lymphocytes Relative: 38 %
MCH: 22.1 pg — ABNORMAL LOW (ref 26.0–34.0)
MCHC: 31.1 g/dL — ABNORMAL LOW (ref 32.0–36.0)
MCV: 71.2 fL — ABNORMAL LOW (ref 80.0–100.0)
Monocytes Absolute: 0.9 10*3/uL (ref 0.2–1.0)
Monocytes Relative: 23 %
NEUTROS PCT: 38 %
Neutro Abs: 1.4 10*3/uL (ref 1.4–6.5)
Platelets: 126 10*3/uL — ABNORMAL LOW (ref 150–440)
RBC: 3.75 MIL/uL — AB (ref 4.40–5.90)
RDW: 26.1 % — ABNORMAL HIGH (ref 11.5–14.5)
WBC: 3.8 10*3/uL (ref 3.8–10.6)

## 2015-06-30 LAB — SAMPLE TO BLOOD BANK

## 2015-06-30 LAB — OCCULT BLOOD X 1 CARD TO LAB, STOOL
FECAL OCCULT BLD: NEGATIVE
Fecal Occult Bld: NEGATIVE

## 2015-06-30 MED ORDER — FERROUS FUM-IRON POLYSACCH 162-115.2 MG PO CAPS: 1.0000 | ORAL_CAPSULE | Freq: Every day | ORAL | Status: DC

## 2015-06-30 MED ORDER — SODIUM CHLORIDE 0.9 % IV SOLN
510.0000 mg | Freq: Once | INTRAVENOUS | Status: AC
  Administered 2015-06-30: 510 mg via INTRAVENOUS
  Filled 2015-06-30: qty 17

## 2015-06-30 MED ORDER — SODIUM CHLORIDE 0.9 % IV SOLN
Freq: Once | INTRAVENOUS | Status: AC
  Administered 2015-06-30: 14:00:00 via INTRAVENOUS
  Filled 2015-06-30: qty 1000

## 2015-06-30 NOTE — Progress Notes (Signed)
[facility]  [phone]  Fax:([phone]) [phone]     Scott Hudson DOB: 1929/08/17  MR#: [id number]  [id number]  Patient Care Team: Albina Billet, MD as PCP - General (Internal Medicine)  CHIEF COMPLAINT:  Chief Complaint  Patient presents with  . Iron deficiency anemia    INTERVAL HISTORY:  Patient is here for continued follow-up regarding iron deficiency anemia.Patient was initially evaluated by Dr. Rogue Bussing approximately 2 weeks ago with a hemoglobin of 6.5, hematocrit 21.6, MCV 65.8, iron 20, TIBC 499, ferritin of 8. Patient reported at that time having been fatigued over the last few months with some shortness of breath with exertion. He continues to complain of chronic fatigue and states that it may be improved slightly. He has also lost approximately 10 pounds in the last 2 months. His appetite remains stable. He does deny any blood in his stool, black or tarry stool. Patient states that he has previously had a colonoscopy but it was many years ago, he cannot remember who with one when.  REVIEW OF SYSTEMS:   Review of Systems  Constitutional: Positive for weight loss and malaise/fatigue. Negative for fever, chills and diaphoresis.  HENT: Negative.   Eyes: Negative.   Respiratory: Positive for shortness of breath. Negative for cough, hemoptysis, sputum production and wheezing.   Cardiovascular: Negative for chest pain, palpitations, orthopnea, claudication, leg swelling and PND.  Gastrointestinal: Positive for heartburn. Negative for nausea, vomiting, abdominal pain, diarrhea, constipation, blood in stool and melena.  Genitourinary: Negative.   Musculoskeletal: Negative.   Skin: Negative.   Neurological: Negative for dizziness, tingling, focal weakness, seizures and weakness.  Endo/Heme/Allergies: Does not bruise/bleed easily.  Psychiatric/Behavioral: Negative for depression. The patient is not nervous/anxious and does not have insomnia.      As per HPI. Otherwise, a complete review of systems is negatve.   PAST MEDICAL HISTORY: Past Medical History  Diagnosis Date  . Hypertension   . GERD (gastroesophageal reflux disease)   . Hypercholesteremia   . Peripheral vascular disease ([place])   . Diabetes mellitus without complication ([place])   . Anemia     PAST SURGICAL HISTORY: Past Surgical History  Procedure Laterality Date  . Rt hand trama, tips of two fingers removed    . Peripheral vascular catheterization  11/30/2014    Procedure: Lower Extremity Intervention;  Surgeon: Katha Cabal, MD;  Location: [facility];  Service: Cardiovascular;;  . Peripheral vascular catheterization N/A 11/30/2014    Procedure: Abdominal Aortogram w/Lower Extremity;  Surgeon: Katha Cabal, MD;  Location: [facility];  Service: Cardiovascular;  Laterality: N/A;    FAMILY HISTORY No family history on file.  GYNECOLOGIC HISTORY:  No LMP for male patient.     ADVANCED DIRECTIVES:    HEALTH MAINTENANCE: Social History  Substance Use Topics  . Smoking status: Former Smoker -- 1.50 packs/day for 40 years    Types: Cigarettes    Quit date: 11/29/2013  . Smokeless tobacco: Not on file  . Alcohol Use: No     Colonoscopy:Unsure of dates, greater than 10 years ago.    No Known Allergies  Current Outpatient Prescriptions  Medication Sig Dispense Refill  . amLODipine (NORVASC) 10 MG tablet Take 10 mg by mouth daily.  6  . hydrALAZINE (APRESOLINE) 10 MG tablet Take 10 mg by mouth 3 (three) times daily.  5  . isosorbide mononitrate (IMDUR) 30 MG 24 hr tablet Take 30 mg by mouth daily.    Marland Kitchen  metFORMIN (GLUCOPHAGE) 500 MG tablet Take 500 mg by mouth daily with breakfast.    . omeprazole (PRILOSEC) 40 MG capsule Take 40 mg by mouth daily. Reported on 06/16/2015    . simvastatin (ZOCOR) 40 MG tablet Take 40 mg by mouth daily.    . tamsulosin (FLOMAX) 0.4 MG CAPS capsule Take 0.4 mg by mouth every evening.  5   No  current facility-administered medications for this visit.    OBJECTIVE: BP 120/52 mmHg  Pulse 64  Temp(Src) 98.2 F (36.8 C) (Oral)  Wt 141 lb 4 oz (64.071 kg)   Body mass index is 21.41 kg/(m^2).    ECOG FS:1 - Symptomatic but completely ambulatory  General: Well-developed, well-nourished, no acute distress. Eyes: Pink conjunctiva, anicteric sclera. HEENT: Normocephalic, moist mucous membranes, clear oropharnyx. Lungs: Clear to auscultation bilaterally. Heart: Regular rate and rhythm. No rubs, murmurs, or gallops. Abdomen: Soft, nontender, nondistended. No organomegaly noted, normoactive bowel sounds. Musculoskeletal: No edema, cyanosis, or clubbing. Neuro: Alert, answering all questions appropriately. Cranial nerves grossly intact. Skin: No rashes or petechiae noted. Psych: Normal affect.   LAB RESULTS:  Appointment on 06/30/2015  Component Date Value Ref Range Status  . WBC 06/30/2015 3.8  3.8 - 10.6 K/uL Final  . RBC 06/30/2015 3.75* 4.40 - 5.90 MIL/uL Final  . Hemoglobin 06/30/2015 8.3* 13.0 - 18.0 g/dL Final  . HCT 06/30/2015 26.7* 40.0 - 52.0 % Final  . MCV 06/30/2015 71.2* 80.0 - 100.0 fL Final  . MCH 06/30/2015 22.1* 26.0 - 34.0 pg Final  . MCHC 06/30/2015 31.1* 32.0 - 36.0 g/dL Final  . RDW 06/30/2015 26.1* 11.5 - 14.5 % Final  . Platelets 06/30/2015 126* 150 - 440 K/uL Final  . Neutrophils Relative % 06/30/2015 38   Final  . Neutro Abs 06/30/2015 1.4  1.4 - 6.5 K/uL Final  . Lymphocytes Relative 06/30/2015 38   Final  . Lymphs Abs 06/30/2015 1.4  1.0 - 3.6 K/uL Final  . Monocytes Relative 06/30/2015 23   Final  . Monocytes Absolute 06/30/2015 0.9  0.2 - 1.0 K/uL Final  . Eosinophils Relative 06/30/2015 0   Final  . Eosinophils Absolute 06/30/2015 0.0  0 - 0.7 K/uL Final  . Basophils Relative 06/30/2015 1   Final  . Basophils Absolute 06/30/2015 0.0  0 - 0.1 K/uL Final  . Blood Bank Specimen 06/30/2015 SAMPLE AVAILABLE FOR TESTING   Final  . Sample Expiration  06/30/2015 07/03/2015   Final  . Fecal Occult Bld 06/29/2015 NEGATIVE  NEGATIVE Final  . Fecal Occult Bld 06/30/2015 NEGATIVE  NEGATIVE Final    STUDIES: No results found.  ASSESSMENT:  Iron deficiency anemia. Weight loss, unintentional.  PLAN: 1. Iron deficiency anemia. Patient received one dose of Feraheme 510 mg approximately 2 weeks ago. Patient also received 2 units of packed RBCs. Patient did turn in his stool cards today and results are pending. We will proceed with second Feraheme 510 mg infusion today. Also advised patient that we would send him prescription for ferrous fumarate to be taken 1 tablet once daily. 2. Unintentional weight loss. CT scan of abdomen and pelvis were negative for any acute abnormality. Discussed with patient's evaluation with a gastroenterologist for possible upper and lower endoscopy. Patient is agreeable to this. Referral has been sent to Dr. Allen Norris.   Patient should return to clinic in approximately 4 weeks for continued evaluation of lab work as well as M.D. visit.  Patient expressed understanding and was in agreement with this plan. He  also understands that He can call clinic at any time with any questions, concerns, or complaints.   Dr. Rogue Bussing was available for consultation and review of plan of care for this patient.  Evlyn Kanner, NP   06/30/2015 2:00 PM

## 2015-06-30 NOTE — Progress Notes (Signed)
Patient reports that he is feeling weak and is sometimes short of breath.  He also states that he has lost weight over the past several months.  Current weight is down 8 lbs from last visit.

## 2015-07-28 ENCOUNTER — Inpatient Hospital Stay: Payer: Commercial Managed Care - PPO | Attending: Internal Medicine

## 2015-07-28 DIAGNOSIS — Z87891 Personal history of nicotine dependence: Secondary | ICD-10-CM | POA: Diagnosis not present

## 2015-07-28 DIAGNOSIS — D696 Thrombocytopenia, unspecified: Secondary | ICD-10-CM | POA: Diagnosis not present

## 2015-07-28 DIAGNOSIS — Z7984 Long term (current) use of oral hypoglycemic drugs: Secondary | ICD-10-CM | POA: Diagnosis not present

## 2015-07-28 DIAGNOSIS — I10 Essential (primary) hypertension: Secondary | ICD-10-CM | POA: Insufficient documentation

## 2015-07-28 DIAGNOSIS — K219 Gastro-esophageal reflux disease without esophagitis: Secondary | ICD-10-CM | POA: Insufficient documentation

## 2015-07-28 DIAGNOSIS — I739 Peripheral vascular disease, unspecified: Secondary | ICD-10-CM | POA: Diagnosis not present

## 2015-07-28 DIAGNOSIS — E119 Type 2 diabetes mellitus without complications: Secondary | ICD-10-CM | POA: Insufficient documentation

## 2015-07-28 DIAGNOSIS — Z79899 Other long term (current) drug therapy: Secondary | ICD-10-CM | POA: Diagnosis not present

## 2015-07-28 DIAGNOSIS — E7800 Pure hypercholesterolemia, unspecified: Secondary | ICD-10-CM | POA: Diagnosis not present

## 2015-07-28 DIAGNOSIS — D509 Iron deficiency anemia, unspecified: Secondary | ICD-10-CM | POA: Insufficient documentation

## 2015-07-28 LAB — CBC WITH DIFFERENTIAL/PLATELET
BASOS ABS: 0 10*3/uL (ref 0–0.1)
BASOS PCT: 1 %
Eosinophils Absolute: 0 10*3/uL (ref 0–0.7)
Eosinophils Relative: 0 %
HEMATOCRIT: 32.7 % — AB (ref 40.0–52.0)
HEMOGLOBIN: 10.7 g/dL — AB (ref 13.0–18.0)
LYMPHS PCT: 44 %
Lymphs Abs: 2.7 10*3/uL (ref 1.0–3.6)
MCH: 25.7 pg — ABNORMAL LOW (ref 26.0–34.0)
MCHC: 32.6 g/dL (ref 32.0–36.0)
MCV: 78.8 fL — ABNORMAL LOW (ref 80.0–100.0)
MONOS PCT: 17 %
Monocytes Absolute: 1.1 10*3/uL — ABNORMAL HIGH (ref 0.2–1.0)
NEUTROS ABS: 2.3 10*3/uL (ref 1.4–6.5)
NEUTROS PCT: 38 %
Platelets: 121 10*3/uL — ABNORMAL LOW (ref 150–440)
RBC: 4.16 MIL/uL — ABNORMAL LOW (ref 4.40–5.90)
RDW: 33.7 % — ABNORMAL HIGH (ref 11.5–14.5)
WBC: 6.1 10*3/uL (ref 3.8–10.6)

## 2015-07-28 LAB — IRON AND TIBC
IRON: 44 ug/dL — AB (ref 45–182)
Saturation Ratios: 14 % — ABNORMAL LOW (ref 17.9–39.5)
TIBC: 310 ug/dL (ref 250–450)
UIBC: 266 ug/dL

## 2015-07-28 LAB — COMPREHENSIVE METABOLIC PANEL
ALBUMIN: 3.9 g/dL (ref 3.5–5.0)
ALT: 23 U/L (ref 17–63)
ANION GAP: 5 (ref 5–15)
AST: 28 U/L (ref 15–41)
Alkaline Phosphatase: 78 U/L (ref 38–126)
BUN: 20 mg/dL (ref 6–20)
CHLORIDE: 106 mmol/L (ref 101–111)
CO2: 23 mmol/L (ref 22–32)
Calcium: 9.4 mg/dL (ref 8.9–10.3)
Creatinine, Ser: 1.37 mg/dL — ABNORMAL HIGH (ref 0.61–1.24)
GFR calc Af Amer: 53 mL/min — ABNORMAL LOW (ref >60)
GFR calc non Af Amer: 46 mL/min — ABNORMAL LOW (ref >60)
GLUCOSE: 94 mg/dL (ref 65–99)
POTASSIUM: 4.6 mmol/L (ref 3.5–5.1)
Sodium: 134 mmol/L — ABNORMAL LOW (ref 135–145)
Total Bilirubin: 0.3 mg/dL (ref 0.3–1.2)
Total Protein: 7.2 g/dL (ref 6.5–8.1)

## 2015-07-28 LAB — FERRITIN: Ferritin: 190 ng/mL (ref 24–336)

## 2015-07-29 ENCOUNTER — Ambulatory Visit: Payer: Self-pay | Admitting: Internal Medicine

## 2015-08-01 ENCOUNTER — Inpatient Hospital Stay: Payer: Commercial Managed Care - PPO | Admitting: Internal Medicine

## 2015-08-01 ENCOUNTER — Telehealth: Payer: Self-pay | Admitting: Gastroenterology

## 2015-08-01 ENCOUNTER — Inpatient Hospital Stay (HOSPITAL_BASED_OUTPATIENT_CLINIC_OR_DEPARTMENT_OTHER): Payer: Commercial Managed Care - PPO | Admitting: Internal Medicine

## 2015-08-01 VITALS — BP 133/69 | HR 79 | Temp 97.5°F | Resp 18 | Wt 142.3 lb

## 2015-08-01 DIAGNOSIS — D509 Iron deficiency anemia, unspecified: Secondary | ICD-10-CM | POA: Diagnosis not present

## 2015-08-01 DIAGNOSIS — D696 Thrombocytopenia, unspecified: Secondary | ICD-10-CM | POA: Diagnosis not present

## 2015-08-01 DIAGNOSIS — Z79899 Other long term (current) drug therapy: Secondary | ICD-10-CM

## 2015-08-01 NOTE — Telephone Encounter (Signed)
Dr Rogue Bussing, in the [facility], is referring patient for a follow-up colonoscopy. Please call patient for colonoscopy screening.

## 2015-08-01 NOTE — Assessment & Plan Note (Addendum)
Unclear etiology. Status post IV ferriheme improvement of the hemoglobin from 6.5 to continue 10.5. Iron studies are adequate. Symptoms improved.  # Unclear etiology- recommend getting EGD colonoscopy. Patient is reluctant. Discussed the importance. appt with Dr.Wohl.  # Mild thrombocytopenia platelets 121 monitor for now.  # Follow-up with me in approximately 4 weeks/labs.

## 2015-08-01 NOTE — Telephone Encounter (Signed)
Called phone number, wife picked up. I left a message with his wife. He will contact me tomorrow

## 2015-08-01 NOTE — Progress Notes (Signed)
[place] NOTE  Patient Care Team: Albina Billet, MD as PCP - General (Internal Medicine)  CHIEF COMPLAINTS/PURPOSE OF CONSULTATION:   # ANEMIA MICROCYTIC- hb 6.5/IDA- s/P ferrahem [June 2017] CT-Aab/pelvis- NEG  # MILD THROMBOCYTOPENIA  [120s]  HISTORY OF PRESENTING ILLNESS:  Scott Hudson 82 y.o.  male with severe iron deficiency anemia unclear etiology is here for follow-up.  Patient declined blood transfusion.; Patient received IV ferriheme 2.   Patient energy levels are improved. His appetite is fair. No further weight loss. Patient has not had a colonoscopy/EGD yet.  Denies any blood in stools. Denies any black stools. Also denies any blood in urine. Remote history of smoking. Previous colonoscopy more than 10 years ago.  ROS: A complete 10 point review of system is done which is negative except mentioned above in history of present illness  MEDICAL HISTORY:  Past Medical History:  Diagnosis Date  . Anemia   . Diabetes mellitus without complication ([place])   . GERD (gastroesophageal reflux disease)   . Hypercholesteremia   . Hypertension   . Peripheral vascular disease ([place])     SURGICAL HISTORY: Past Surgical History:  Procedure Laterality Date  . PERIPHERAL VASCULAR CATHETERIZATION  11/30/2014   Procedure: Lower Extremity Intervention;  Surgeon: Katha Cabal, MD;  Location: [facility];  Service: Cardiovascular;;  . PERIPHERAL VASCULAR CATHETERIZATION N/A 11/30/2014   Procedure: Abdominal Aortogram w/Lower Extremity;  Surgeon: Katha Cabal, MD;  Location: [facility];  Service: Cardiovascular;  Laterality: N/A;  . rt hand trama, tips of two fingers removed      SOCIAL HISTORY: Social History   Social History  . Marital status: Married    Spouse name: N/A  . Number of children: N/A  . Years of education: N/A   Occupational History  . Not on file.   Social History Main Topics  . Smoking status: Former  Smoker    Packs/day: 1.50    Years: 40.00    Types: Cigarettes    Quit date: 11/29/2013  . Smokeless tobacco: Not on file  . Alcohol use No  . Drug use: No  . Sexual activity: Not on file   Other Topics Concern  . Not on file   Social History Narrative  . No narrative on file    FAMILY HISTORY: No family history on file.  ALLERGIES:  has No Known Allergies.  MEDICATIONS:  Current Outpatient Prescriptions  Medication Sig Dispense Refill  . amLODipine (NORVASC) 10 MG tablet Take 10 mg by mouth daily.  6  . ferrous fumarate-iron polysaccharide complex (TANDEM) 162-115.2 MG CAPS capsule Take 1 capsule by mouth daily with breakfast. 30 capsule 3  . hydrALAZINE (APRESOLINE) 10 MG tablet Take 10 mg by mouth 3 (three) times daily.  5  . isosorbide mononitrate (IMDUR) 30 MG 24 hr tablet Take 30 mg by mouth daily.    . metFORMIN (GLUCOPHAGE) 500 MG tablet Take 500 mg by mouth daily with breakfast.    . omeprazole (PRILOSEC) 40 MG capsule Take 40 mg by mouth daily. Reported on 06/16/2015    . simvastatin (ZOCOR) 40 MG tablet Take 40 mg by mouth daily.    . tamsulosin (FLOMAX) 0.4 MG CAPS capsule Take 0.4 mg by mouth every evening.  5   No current facility-administered medications for this visit.       Marland Kitchen  PHYSICAL EXAMINATION:   Vitals:   08/01/15 1044  BP: 133/69  Pulse: 79  Resp: 18  Temp: 97.5 F (36.4 C)   Filed Weights   08/01/15 1044  Weight: 142 lb 5 oz (64.6 kg)    GENERAL: Thin built moderately nourished male patient Alert, no distress and comfortable.  Accompanied by his wife. EYES: Positive for pallor; no icterus OROPHARYNX: no thrush or ulceration; good dentition  NECK: supple, no masses felt LYMPH:  no palpable lymphadenopathy in the cervical, axillary or inguinal regions LUNGS: clear to auscultation and  No wheeze or crackles HEART/CVS: regular rate & rhythm and no murmurs; No lower extremity edema ABDOMEN: abdomen soft, non-tender and normal bowel  sounds Musculoskeletal:no cyanosis of digits and no clubbing  PSYCH: alert & oriented x 3 with fluent speech NEURO: no focal motor/sensory deficits SKIN:  no rashes or significant lesions  LABORATORY DATA:  I have reviewed the data as listed Lab Results  Component Value Date   WBC 6.1 07/28/2015   HGB 10.7 (L) 07/28/2015   HCT 32.7 (L) 07/28/2015   MCV 78.8 (L) 07/28/2015   PLT 121 (L) 07/28/2015    Recent Labs  11/29/14 0838 06/16/15 0948 07/28/15 0847  NA  --  137 134*  K  --  4.6 4.6  CL  --  108 106  CO2  --  24 23  GLUCOSE  --  161* 94  BUN 15 19 20   CREATININE 1.20 1.19 1.37*  CALCIUM  --  9.5 9.4  GFRNONAA 54* 55* 46*  GFRAA >60 >60 53*  PROT  --  7.6 7.2  ALBUMIN  --  4.3 3.9  AST  --  31 28  ALT  --  22 23  ALKPHOS  --  65 78  BILITOT  --  0.5 0.3    RADIOGRAPHIC STUDIES: I have personally reviewed the radiological images as listed and agreed with the findings in the report. No results found.  ASSESSMENT & PLAN:   Iron deficiency anemia Unclear etiology. Status post IV ferriheme improvement of the hemoglobin from 6.5 to continue 10.5. Iron studies are adequate. Symptoms improved.  # Unclear etiology- recommend getting EGD colonoscopy. Patient is reluctant. Discussed the importance. appt with Dr.Wohl.  # Mild thrombocytopenia platelets 121 monitor for now.  # Follow-up with me in approximately 4 weeks/labs.         Cammie Sickle, MD 08/01/2015 6:29 PM

## 2015-08-02 ENCOUNTER — Other Ambulatory Visit: Payer: Self-pay

## 2015-08-02 ENCOUNTER — Telehealth: Payer: Self-pay

## 2015-08-02 NOTE — Telephone Encounter (Signed)
Colonoscopy scheduled 09/20/2015  Screening colonoscopy [id number] Please pre cert

## 2015-08-02 NOTE — Telephone Encounter (Signed)
Gastroenterology Pre-Procedure Review  Request Date: 09/20/2015 Requesting Physician:   PATIENT REVIEW QUESTIONS: The patient responded to the following health history questions as indicated:    1. Are you having any GI issues? no 2. Do you have a personal history of Polyps? no 3. Do you have a family history of Colon Cancer or Polyps? no 4. Diabetes Mellitus? no 5. Joint replacements in the past 12 months?no 6. Major health problems in the past 3 months?no 7. Any artificial heart valves, MVP, or defibrillator?no    MEDICATIONS & ALLERGIES:    Patient reports the following regarding taking any anticoagulation/antiplatelet therapy:   Plavix, Coumadin, Eliquis, Xarelto, Lovenox, Pradaxa, Brilinta, or Effient? no Aspirin? no  Patient confirms/reports the following medications:  Current Outpatient Prescriptions  Medication Sig Dispense Refill  . amLODipine (NORVASC) 10 MG tablet Take 10 mg by mouth daily.  6  . ferrous fumarate-iron polysaccharide complex (TANDEM) 162-115.2 MG CAPS capsule Take 1 capsule by mouth daily with breakfast. 30 capsule 3  . hydrALAZINE (APRESOLINE) 10 MG tablet Take 10 mg by mouth 3 (three) times daily.  5  . metFORMIN (GLUCOPHAGE) 500 MG tablet Take 500 mg by mouth daily with breakfast.    . omeprazole (PRILOSEC) 40 MG capsule Take 40 mg by mouth daily. Reported on 06/16/2015    . tamsulosin (FLOMAX) 0.4 MG CAPS capsule Take 0.4 mg by mouth every evening.  5  . simvastatin (ZOCOR) 40 MG tablet Take 40 mg by mouth daily.     No current facility-administered medications for this visit.     Patient confirms/reports the following allergies:  No Known Allergies  No orders of the defined types were placed in this encounter.   AUTHORIZATION INFORMATION Primary Insurance: 1D#: Group #:  Secondary Insurance: 1D#: Group #:  SCHEDULE INFORMATION: Date: 09/20/2015 Time: Location: [facility]

## 2015-08-02 NOTE — Telephone Encounter (Signed)
Colonoscopy scheduled 09/20/2015 [facility]

## 2015-09-01 ENCOUNTER — Inpatient Hospital Stay: Payer: Commercial Managed Care - PPO

## 2015-09-01 ENCOUNTER — Inpatient Hospital Stay: Payer: Commercial Managed Care - PPO | Attending: Internal Medicine | Admitting: Internal Medicine

## 2015-09-01 ENCOUNTER — Encounter: Payer: Self-pay | Admitting: Internal Medicine

## 2015-09-01 VITALS — BP 152/64 | HR 58 | Temp 95.4°F | Resp 16 | Ht 68.0 in | Wt 141.2 lb

## 2015-09-01 DIAGNOSIS — K219 Gastro-esophageal reflux disease without esophagitis: Secondary | ICD-10-CM | POA: Diagnosis not present

## 2015-09-01 DIAGNOSIS — E119 Type 2 diabetes mellitus without complications: Secondary | ICD-10-CM | POA: Diagnosis not present

## 2015-09-01 DIAGNOSIS — D696 Thrombocytopenia, unspecified: Secondary | ICD-10-CM

## 2015-09-01 DIAGNOSIS — I739 Peripheral vascular disease, unspecified: Secondary | ICD-10-CM | POA: Insufficient documentation

## 2015-09-01 DIAGNOSIS — Z7984 Long term (current) use of oral hypoglycemic drugs: Secondary | ICD-10-CM | POA: Insufficient documentation

## 2015-09-01 DIAGNOSIS — E7800 Pure hypercholesterolemia, unspecified: Secondary | ICD-10-CM | POA: Insufficient documentation

## 2015-09-01 DIAGNOSIS — I10 Essential (primary) hypertension: Secondary | ICD-10-CM | POA: Insufficient documentation

## 2015-09-01 DIAGNOSIS — Z87891 Personal history of nicotine dependence: Secondary | ICD-10-CM | POA: Insufficient documentation

## 2015-09-01 DIAGNOSIS — D509 Iron deficiency anemia, unspecified: Secondary | ICD-10-CM | POA: Insufficient documentation

## 2015-09-01 DIAGNOSIS — Z79899 Other long term (current) drug therapy: Secondary | ICD-10-CM | POA: Diagnosis not present

## 2015-09-01 LAB — CBC WITH DIFFERENTIAL/PLATELET
BASOS PCT: 0 %
Basophils Absolute: 0 10*3/uL (ref 0–0.1)
Eosinophils Absolute: 0.1 10*3/uL (ref 0–0.7)
Eosinophils Relative: 2 %
HEMATOCRIT: 35.5 % — AB (ref 40.0–52.0)
Hemoglobin: 12 g/dL — ABNORMAL LOW (ref 13.0–18.0)
Lymphocytes Relative: 36 %
Lymphs Abs: 1.6 10*3/uL (ref 1.0–3.6)
MCH: 28.4 pg (ref 26.0–34.0)
MCHC: 33.9 g/dL (ref 32.0–36.0)
MCV: 83.6 fL (ref 80.0–100.0)
MONO ABS: 0.6 10*3/uL (ref 0.2–1.0)
MONOS PCT: 13 %
NEUTROS ABS: 2.1 10*3/uL (ref 1.4–6.5)
Neutrophils Relative %: 49 %
Platelets: 129 10*3/uL — ABNORMAL LOW (ref 150–440)
RBC: 4.24 MIL/uL — ABNORMAL LOW (ref 4.40–5.90)
RDW: 26 % — AB (ref 11.5–14.5)
WBC: 4.4 10*3/uL (ref 3.8–10.6)

## 2015-09-01 LAB — COMPREHENSIVE METABOLIC PANEL
ALBUMIN: 4 g/dL (ref 3.5–5.0)
ALT: 22 U/L (ref 17–63)
ANION GAP: 6 (ref 5–15)
AST: 29 U/L (ref 15–41)
Alkaline Phosphatase: 65 U/L (ref 38–126)
BILIRUBIN TOTAL: 0.4 mg/dL (ref 0.3–1.2)
BUN: 23 mg/dL — ABNORMAL HIGH (ref 6–20)
CO2: 22 mmol/L (ref 22–32)
Calcium: 8.9 mg/dL (ref 8.9–10.3)
Chloride: 106 mmol/L (ref 101–111)
Creatinine, Ser: 1.01 mg/dL (ref 0.61–1.24)
GLUCOSE: 80 mg/dL (ref 65–99)
POTASSIUM: 4 mmol/L (ref 3.5–5.1)
Sodium: 134 mmol/L — ABNORMAL LOW (ref 135–145)
TOTAL PROTEIN: 7.5 g/dL (ref 6.5–8.1)

## 2015-09-01 LAB — IRON AND TIBC
IRON: 50 ug/dL (ref 45–182)
SATURATION RATIOS: 16 % — AB (ref 17.9–39.5)
TIBC: 309 ug/dL (ref 250–450)
UIBC: 259 ug/dL

## 2015-09-01 LAB — FERRITIN: Ferritin: 45 ng/mL (ref 24–336)

## 2015-09-01 NOTE — Assessment & Plan Note (Addendum)
Unclear etiology. Status post IV ferriheme improvement of the hemoglobin from 6.5 to continue 12. Awaiting on Iron studies.   # Unclear etiology- recommend getting EGD colonoscopy.appt with Dr.Wohl/ next month.   # Mild thrombocytopenia platelets 129 monitor for now.  # Follow-up with me in approximately 3 months; reluctant to IV iron. Will call if IV iron needed

## 2015-09-01 NOTE — Progress Notes (Signed)
[place] NOTE  Patient Care Team: Albina Billet, MD as PCP - General (Internal Medicine)  CHIEF COMPLAINTS/PURPOSE OF CONSULTATION:   # ANEMIA MICROCYTIC- hb 6.5/IDA- s/P ferrahem [June 2017] CT-Aab/pelvis- NEG  # MILD THROMBOCYTOPENIA  [120s]  HISTORY OF PRESENTING ILLNESS:  Scott Hudson 82 y.o.  male with severe iron deficiency anemia unclear etiology is here for follow-up.  Patient still awaiting colonoscopy EGD. Patient received IV ferriheme 2- in June- energy is improved. No further weight loss. However complains of not gaining weight.  Denies any blood in stools. Denies any black stools. Also denies any blood in urine.  ROS: A complete 10 point review of system is done which is negative except mentioned above in history of present illness  MEDICAL HISTORY:  Past Medical History:  Diagnosis Date  . Anemia   . Diabetes mellitus without complication ([place])   . GERD (gastroesophageal reflux disease)   . Hypercholesteremia   . Hypertension   . Peripheral vascular disease ([place])     SURGICAL HISTORY: Past Surgical History:  Procedure Laterality Date  . PERIPHERAL VASCULAR CATHETERIZATION  11/30/2014   Procedure: Lower Extremity Intervention;  Surgeon: Katha Cabal, MD;  Location: [facility];  Service: Cardiovascular;;  . PERIPHERAL VASCULAR CATHETERIZATION N/A 11/30/2014   Procedure: Abdominal Aortogram w/Lower Extremity;  Surgeon: Katha Cabal, MD;  Location: [facility];  Service: Cardiovascular;  Laterality: N/A;  . rt hand trama, tips of two fingers removed      SOCIAL HISTORY: Social History   Social History  . Marital status: Married    Spouse name: N/A  . Number of children: N/A  . Years of education: N/A   Occupational History  . Not on file.   Social History Main Topics  . Smoking status: Former Smoker    Packs/day: 1.50    Years: 40.00    Types: Cigarettes    Quit date: 11/29/2013  .  Smokeless tobacco: Never Used  . Alcohol use No  . Drug use: No  . Sexual activity: Not on file   Other Topics Concern  . Not on file   Social History Narrative  . No narrative on file    FAMILY HISTORY: No family history on file.  ALLERGIES:  has No Known Allergies.  MEDICATIONS:  Current Outpatient Prescriptions  Medication Sig Dispense Refill  . amLODipine (NORVASC) 10 MG tablet Take 10 mg by mouth daily.  6  . ferrous fumarate-iron polysaccharide complex (TANDEM) 162-115.2 MG CAPS capsule Take 1 capsule by mouth daily with breakfast. 30 capsule 3  . hydrALAZINE (APRESOLINE) 10 MG tablet Take 10 mg by mouth 3 (three) times daily.  5  . isosorbide mononitrate (IMDUR) 30 MG 24 hr tablet Take 30 mg by mouth daily.  5  . metFORMIN (GLUCOPHAGE) 500 MG tablet Take 500 mg by mouth daily with breakfast.    . omeprazole (PRILOSEC) 40 MG capsule Take 40 mg by mouth daily. Reported on 06/16/2015    . simvastatin (ZOCOR) 40 MG tablet Take 40 mg by mouth daily.    . tamsulosin (FLOMAX) 0.4 MG CAPS capsule Take 0.4 mg by mouth every evening.  5   No current facility-administered medications for this visit.       Marland Kitchen  PHYSICAL EXAMINATION:   Vitals:   09/01/15 1010  BP: (!) 152/64  Pulse: (!) 58  Resp: 16  Temp: (!) 95.4 F (35.2 C)   Filed Weights   09/01/15  1010  Weight: 141 lb 3.2 oz (64 kg)    GENERAL: Thin built moderately nourished male patient Alert, no distress and comfortable.  Accompanied by his wife. EYES: Positive for pallor; no icterus OROPHARYNX: no thrush or ulceration; good dentition  NECK: supple, no masses felt LYMPH:  no palpable lymphadenopathy in the cervical, axillary or inguinal regions LUNGS: clear to auscultation and  No wheeze or crackles HEART/CVS: regular rate & rhythm and no murmurs; No lower extremity edema ABDOMEN: abdomen soft, non-tender and normal bowel sounds Musculoskeletal:no cyanosis of digits and no clubbing  PSYCH: alert & oriented x  3 with fluent speech NEURO: no focal motor/sensory deficits SKIN:  no rashes or significant lesions  LABORATORY DATA:  I have reviewed the data as listed Lab Results  Component Value Date   WBC 4.4 09/01/2015   HGB 12.0 (L) 09/01/2015   HCT 35.5 (L) 09/01/2015   MCV 83.6 09/01/2015   PLT 129 (L) 09/01/2015    Recent Labs  06/16/15 0948 07/28/15 0847 09/01/15 0949  NA 137 134* 134*  K 4.6 4.6 4.0  CL 108 106 106  CO2 24 23 22   GLUCOSE 161* 94 80  BUN 19 20 23*  CREATININE 1.19 1.37* 1.01  CALCIUM 9.5 9.4 8.9  GFRNONAA 55* 46* >60  GFRAA >60 53* >60  PROT 7.6 7.2 7.5  ALBUMIN 4.3 3.9 4.0  AST 31 28 29   ALT 22 23 22   ALKPHOS 65 78 65  BILITOT 0.5 0.3 0.4    RADIOGRAPHIC STUDIES: I have personally reviewed the radiological images as listed and agreed with the findings in the report. No results found.  ASSESSMENT & PLAN:   Iron deficiency anemia Unclear etiology. Status post IV ferriheme improvement of the hemoglobin from 6.5 to continue 12. Awaiting on Iron studies.   # Unclear etiology- recommend getting EGD colonoscopy.appt with Dr.Wohl/ next month.   # Mild thrombocytopenia platelets 129 monitor for now.  # Follow-up with me in approximately 3 months; reluctant to IV iron. Will call if IV iron needed     Cammie Sickle, MD 09/02/2015 8:35 AM

## 2015-09-01 NOTE — Progress Notes (Signed)
No concerns today 

## 2015-09-05 ENCOUNTER — Telehealth: Payer: Self-pay | Admitting: *Deleted

## 2015-09-05 NOTE — Telephone Encounter (Signed)
Called patient and LVM that MD does not recommend IV iron at this time.  Patient to continue taking iron by mouth.

## 2015-09-05 NOTE — Telephone Encounter (Signed)
-----   Message from Cammie Sickle, MD sent at 09/02/2015  7:00 PM EDT ----- Recommend holding off IV iron at this time; iron levels were okay. Continue by mouth iron. Please inform patient

## 2015-09-05 NOTE — Progress Notes (Signed)
Can you set this patient up for an EGD and colonoscopy.

## 2015-09-20 ENCOUNTER — Ambulatory Visit
Admission: RE | Admit: 2015-09-20 | Discharge: 2015-09-20 | Disposition: A | Discharge status: Home / Self Care | Payer: Commercial Managed Care - PPO | Source: Ambulatory Visit | Attending: Gastroenterology | Admitting: Gastroenterology

## 2015-09-20 ENCOUNTER — Encounter
Admission: RE | Disposition: A | Discharge status: Home / Self Care | Payer: Self-pay | Source: Ambulatory Visit | Attending: Gastroenterology

## 2015-09-20 ENCOUNTER — Encounter: Payer: Self-pay | Admitting: Gastroenterology

## 2015-09-20 ENCOUNTER — Other Ambulatory Visit: Payer: Self-pay

## 2015-09-20 DIAGNOSIS — Z539 Procedure and treatment not carried out, unspecified reason: Secondary | ICD-10-CM | POA: Diagnosis not present

## 2015-09-20 HISTORY — DX: Thrombocytopenia, unspecified: D69.6

## 2015-09-20 LAB — GLUCOSE, CAPILLARY: Glucose-Capillary: 79 mg/dL (ref 65–99)

## 2015-09-20 SURGERY — COLONOSCOPY WITH PROPOFOL
Anesthesia: General

## 2015-09-20 MED ORDER — SODIUM CHLORIDE 0.9 % IV SOLN: INTRAVENOUS | Status: DC

## 2015-09-20 NOTE — OR Nursing (Signed)
Patient did not follow prep instructions and ate regular food all day yesterday.  Patient also drank 2 16 oz. Bottles of water on the way to the hospital.

## 2015-09-20 NOTE — OR Nursing (Signed)
Patient and wife instructed intensively regarding prep and PO intake day of procedure.

## 2015-09-20 NOTE — Telephone Encounter (Signed)
Reschedule colonoscopy 

## 2015-09-29 NOTE — Telephone Encounter (Signed)
error 

## 2015-10-28 ENCOUNTER — Other Ambulatory Visit: Payer: Self-pay | Admitting: *Deleted

## 2015-10-28 MED ORDER — FERROUS FUM-IRON POLYSACCH 162-115.2 MG PO CAPS: 1.0000 | ORAL_CAPSULE | Freq: Every day | ORAL | 6 refills | Status: DC

## 2015-11-08 ENCOUNTER — Encounter
Admission: RE | Disposition: A | Discharge status: Home / Self Care | Payer: Self-pay | Source: Ambulatory Visit | Attending: Gastroenterology

## 2015-11-08 ENCOUNTER — Ambulatory Visit
Admission: RE | Admit: 2015-11-08 | Discharge: 2015-11-08 | Disposition: A | Discharge status: Home / Self Care | Payer: Commercial Managed Care - PPO | Source: Ambulatory Visit | Attending: Gastroenterology | Admitting: Gastroenterology

## 2015-11-08 ENCOUNTER — Ambulatory Visit: Payer: Commercial Managed Care - PPO | Admitting: Anesthesiology

## 2015-11-08 ENCOUNTER — Encounter: Payer: Self-pay | Admitting: *Deleted

## 2015-11-08 DIAGNOSIS — Z7984 Long term (current) use of oral hypoglycemic drugs: Secondary | ICD-10-CM | POA: Insufficient documentation

## 2015-11-08 DIAGNOSIS — D509 Iron deficiency anemia, unspecified: Secondary | ICD-10-CM | POA: Diagnosis not present

## 2015-11-08 DIAGNOSIS — Z87891 Personal history of nicotine dependence: Secondary | ICD-10-CM | POA: Diagnosis not present

## 2015-11-08 DIAGNOSIS — K641 Second degree hemorrhoids: Secondary | ICD-10-CM | POA: Diagnosis not present

## 2015-11-08 DIAGNOSIS — E7800 Pure hypercholesterolemia, unspecified: Secondary | ICD-10-CM | POA: Insufficient documentation

## 2015-11-08 DIAGNOSIS — E1151 Type 2 diabetes mellitus with diabetic peripheral angiopathy without gangrene: Secondary | ICD-10-CM | POA: Diagnosis not present

## 2015-11-08 DIAGNOSIS — D649 Anemia, unspecified: Secondary | ICD-10-CM

## 2015-11-08 DIAGNOSIS — Z79899 Other long term (current) drug therapy: Secondary | ICD-10-CM | POA: Diagnosis not present

## 2015-11-08 DIAGNOSIS — K219 Gastro-esophageal reflux disease without esophagitis: Secondary | ICD-10-CM | POA: Insufficient documentation

## 2015-11-08 DIAGNOSIS — D508 Other iron deficiency anemias: Secondary | ICD-10-CM

## 2015-11-08 DIAGNOSIS — K5730 Diverticulosis of large intestine without perforation or abscess without bleeding: Secondary | ICD-10-CM | POA: Insufficient documentation

## 2015-11-08 DIAGNOSIS — D124 Benign neoplasm of descending colon: Secondary | ICD-10-CM

## 2015-11-08 DIAGNOSIS — I10 Essential (primary) hypertension: Secondary | ICD-10-CM | POA: Insufficient documentation

## 2015-11-08 HISTORY — PX: COLONOSCOPY WITH PROPOFOL: SHX5780

## 2015-11-08 LAB — GLUCOSE, CAPILLARY: GLUCOSE-CAPILLARY: 70 mg/dL (ref 65–99)

## 2015-11-08 SURGERY — COLONOSCOPY WITH PROPOFOL
Anesthesia: General

## 2015-11-08 MED ORDER — EPHEDRINE SULFATE 50 MG/ML IJ SOLN
INTRAMUSCULAR | Status: DC | PRN
  Administered 2015-11-08: 10 mg via INTRAVENOUS

## 2015-11-08 MED ORDER — MIDAZOLAM HCL 5 MG/5ML IJ SOLN
INTRAMUSCULAR | Status: DC | PRN
  Administered 2015-11-08: 1 mg via INTRAVENOUS

## 2015-11-08 MED ORDER — PROPOFOL 10 MG/ML IV BOLUS
INTRAVENOUS | Status: DC | PRN
  Administered 2015-11-08: 80 mg via INTRAVENOUS

## 2015-11-08 MED ORDER — SODIUM CHLORIDE 0.9 % IV SOLN
INTRAVENOUS | Status: DC | PRN
  Administered 2015-11-08: 08:00:00 via INTRAVENOUS

## 2015-11-08 MED ORDER — FENTANYL CITRATE (PF) 100 MCG/2ML IJ SOLN
INTRAMUSCULAR | Status: DC | PRN
  Administered 2015-11-08: 50 ug via INTRAVENOUS

## 2015-11-08 MED ORDER — LIDOCAINE 2% (20 MG/ML) 5 ML SYRINGE
INTRAMUSCULAR | Status: DC | PRN
  Administered 2015-11-08: 30 mg via INTRAVENOUS

## 2015-11-08 MED ORDER — PHENYLEPHRINE HCL 10 MG/ML IJ SOLN
INTRAMUSCULAR | Status: DC | PRN
  Administered 2015-11-08 (×2): 100 ug via INTRAVENOUS

## 2015-11-08 MED ORDER — SODIUM CHLORIDE 0.9 % IV SOLN: INTRAVENOUS | Status: DC

## 2015-11-08 MED ORDER — PROPOFOL 500 MG/50ML IV EMUL
INTRAVENOUS | Status: DC | PRN
  Administered 2015-11-08: 120 ug/kg/min via INTRAVENOUS

## 2015-11-08 NOTE — Transfer of Care (Signed)
Immediate Anesthesia Transfer of Care Note  Patient: Scott Hudson  Procedure(s) Performed: Procedure(s): COLONOSCOPY WITH PROPOFOL (N/A)  Patient Location: PACU and Endoscopy Unit  Anesthesia Type:General  Level of Consciousness: awake  Airway & Oxygen Therapy: Patient Spontanous Breathing and Patient connected to nasal cannula oxygen  Post-op Assessment: Report given to RN and Post -op Vital signs reviewed and stable  Post vital signs: stable  Last Vitals:  Vitals:   11/08/15 0832 11/08/15 0842  BP: (!) 99/38 (!) 106/44  Pulse: 62 (!) 54  Resp: 16 17  Temp: (!) 35.7 C     Last Pain:  Vitals:   11/08/15 0832  TempSrc: Tympanic         Complications: No apparent anesthesia complications

## 2015-11-08 NOTE — Op Note (Signed)
[facility] Gastroenterology Patient Name: Scott Hudson Procedure Date: 11/08/2015 8:04 AM MRN: [id number] Account #: [id number] Date of Birth: 20-Apr-1929 Admit Type: Outpatient Age: 82 Room: [facility] ENDO ROOM 4 Gender: Male Note Status: Finalized Procedure:            Colonoscopy Indications:          Iron deficiency anemia Providers:            Lucilla Lame MD, MD Referring MD:         Leona Carry. Hall Busing, MD (Referring MD) Medicines:            Propofol per Anesthesia Complications:        No immediate complications. Procedure:            Pre-Anesthesia Assessment:                       - Prior to the procedure, a History and Physical was                        performed, and patient medications and allergies were                        reviewed. The patient's tolerance of previous                        anesthesia was also reviewed. The risks and benefits of                        the procedure and the sedation options and risks were                        discussed with the patient. All questions were                        answered, and informed consent was obtained. Prior                        Anticoagulants: The patient has taken no previous                        anticoagulant or antiplatelet agents. ASA Grade                        Assessment: II - A patient with mild systemic disease.                        After reviewing the risks and benefits, the patient was                        deemed in satisfactory condition to undergo the                        procedure.                       After obtaining informed consent, the colonoscope was                        passed under direct vision. Throughout the procedure,  the patient's blood pressure, pulse, and oxygen                        saturations were monitored continuously. The                        Colonoscope was introduced through the anus and                        advanced to  the the terminal ileum, with identification                        of the appendiceal orifice and IC valve. The                        colonoscopy was performed without difficulty. The                        patient tolerated the procedure well. The quality of                        the bowel preparation was excellent. Findings:      The perianal and digital rectal examinations were normal.      A 4 mm polyp was found in the descending colon. The polyp was sessile.       The polyp was removed with a cold biopsy forceps. Resection and       retrieval were complete.      A single small-mouthed diverticulum was found in the sigmoid colon.      Non-bleeding internal hemorrhoids were found during retroflexion. The       hemorrhoids were Grade II (internal hemorrhoids that prolapse but reduce       spontaneously).      The terminal ileum appeared normal. Impression:           - One 4 mm polyp in the descending colon, removed with                        a cold biopsy forceps. Resected and retrieved.                       - Diverticulosis in the sigmoid colon.                       - Non-bleeding internal hemorrhoids.                       - The examined portion of the ileum was normal. Recommendation:       - Discharge patient to home.                       - Resume previous diet.                       - Continue present medications.                       - Await pathology results. Procedure Code(s):    --- Professional ---                       [phone], Colonoscopy, flexible; with biopsy, single or  multiple Diagnosis Code(s):    --- Professional ---                       D50.9, Iron deficiency anemia, unspecified                       D12.4, Benign neoplasm of descending colon CPT copyright 2016 American Medical Association. All rights reserved. The codes documented in this report are preliminary and upon coder review may  be revised to meet current compliance  requirements. Lucilla Lame MD, MD 11/08/2015 8:29:00 AM This report has been signed electronically. Number of Addenda: 0 Note Initiated On: 11/08/2015 8:04 AM Scope Withdrawal Time: 0 hours 11 minutes 0 seconds  Total Procedure Duration: 0 hours 14 minutes 50 seconds       [facility]

## 2015-11-08 NOTE — H&P (Signed)
Lucilla Lame, MD [address] [place], [place] [address] Phone: [phone] Fax : [phone]  Primary Care Physician:  Albina Billet, MD Primary Gastroenterologist:  Dr. Allen Norris  Pre-Procedure History & Physical: HPI:  Scott Hudson is a 82 y.o. male is here for an colonoscopy.   Past Medical History:  Diagnosis Date  . Anemia   . Diabetes mellitus without complication ([place])   . GERD (gastroesophageal reflux disease)   . Hypercholesteremia   . Hypertension   . Peripheral vascular disease ([place])   . Thrombocytopenia ([place])     Past Surgical History:  Procedure Laterality Date  . COLONOSCOPY    . PERIPHERAL VASCULAR CATHETERIZATION  11/30/2014   Procedure: Lower Extremity Intervention;  Surgeon: Katha Cabal, MD;  Location: [facility];  Service: Cardiovascular;;  . PERIPHERAL VASCULAR CATHETERIZATION N/A 11/30/2014   Procedure: Abdominal Aortogram w/Lower Extremity;  Surgeon: Katha Cabal, MD;  Location: [facility];  Service: Cardiovascular;  Laterality: N/A;  . rt hand trama, tips of two fingers removed      Prior to Admission medications   Medication Sig Start Date End Date Taking? Authorizing Provider  amLODipine (NORVASC) 10 MG tablet Take 10 mg by mouth daily. 06/09/15  Yes Historical Provider, MD  ferrous fumarate-iron polysaccharide complex (TANDEM) 162-115.2 MG CAPS capsule Take 1 capsule by mouth daily with breakfast. 10/28/15  Yes Cammie Sickle, MD  hydrALAZINE (APRESOLINE) 10 MG tablet Take 10 mg by mouth 3 (three) times daily. 06/09/15  Yes Historical Provider, MD  isosorbide mononitrate (IMDUR) 30 MG 24 hr tablet Take 30 mg by mouth daily. 07/28/15  Yes Historical Provider, MD  metFORMIN (GLUCOPHAGE) 500 MG tablet Take 500 mg by mouth daily with breakfast.   Yes Historical Provider, MD  omeprazole (PRILOSEC) 40 MG capsule Take 40 mg by mouth daily. Reported on 06/16/2015   Yes Historical Provider, MD  simvastatin  (ZOCOR) 40 MG tablet Take 40 mg by mouth daily.   Yes Historical Provider, MD  tamsulosin (FLOMAX) 0.4 MG CAPS capsule Take 0.4 mg by mouth every evening. 05/29/15  Yes Historical Provider, MD    Allergies as of 09/20/2015  . (No Known Allergies)    History reviewed. No pertinent family history.  Social History   Social History  . Marital status: Married    Spouse name: N/A  . Number of children: N/A  . Years of education: N/A   Occupational History  . Not on file.   Social History Main Topics  . Smoking status: Former Smoker    Packs/day: 1.50    Years: 40.00    Types: Cigarettes    Quit date: 11/29/2013  . Smokeless tobacco: Never Used  . Alcohol use No  . Drug use: No  . Sexual activity: Not on file   Other Topics Concern  . Not on file   Social History Narrative  . No narrative on file    Review of Systems: See HPI, otherwise negative ROS  Physical Exam: BP 129/68   Pulse 68   Temp (!) 96.9 F (36.1 C) (Tympanic)   Resp 16   Ht 5' 8.5" (1.74 m)   Wt 148 lb (67.1 kg)   SpO2 99%   BMI 22.18 kg/m  General:   Alert,  pleasant and cooperative in NAD Head:  Normocephalic and atraumatic. Neck:  Supple; no masses or thyromegaly. Lungs:  Clear throughout to auscultation.    Heart:  Regular rate and rhythm. Abdomen:  Soft, nontender and nondistended. Normal bowel sounds, without guarding, and without rebound.   Neurologic:  Alert and  oriented x4;  grossly normal neurologically.  Impression/Plan: Scott Hudson is here for an colonoscopy to be performed for anemia  Risks, benefits, limitations, and alternatives regarding  colonoscopy have been reviewed with the patient.  Questions have been answered.  All parties agreeable.   Lucilla Lame, MD  11/08/2015, 7:49 AM

## 2015-11-08 NOTE — Anesthesia Preprocedure Evaluation (Signed)
Anesthesia Evaluation  Patient identified by MRN, date of birth, ID band Patient awake    Reviewed: Allergy & Precautions, NPO status , Patient's Chart, lab work & pertinent test results  History of Anesthesia Complications Negative for: history of anesthetic complications  Airway Mallampati: II  TM Distance: >3 FB Neck ROM: Full    Dental  (+) Upper Dentures, Lower Dentures   Pulmonary neg sleep apnea, neg COPD, former smoker,    breath sounds clear to auscultation- rhonchi (-) wheezing      Cardiovascular Exercise Tolerance: Good hypertension, Pt. on medications (-) CAD and (-) Past MI  Rhythm:Regular Rate:Normal - Systolic murmurs and - Diastolic murmurs    Neuro/Psych negative neurological ROS  negative psych ROS   GI/Hepatic Neg liver ROS, GERD  ,  Endo/Other  diabetes, Type 2, Oral Hypoglycemic Agents  Renal/GU negative Renal ROS     Musculoskeletal negative musculoskeletal ROS (+)   Abdominal (+) - obese,   Peds  Hematology  (+) anemia ,   Anesthesia Other Findings Past Medical History: No date: Anemia No date: Diabetes mellitus without complication (HCC) No date: GERD (gastroesophageal reflux disease) No date: Hypercholesteremia No date: Hypertension No date: Peripheral vascular disease (HCC) No date: Thrombocytopenia ([place])   Reproductive/Obstetrics                             Anesthesia Physical Anesthesia Plan  ASA: II  Anesthesia Plan: General   Post-op Pain Management:    Induction: Intravenous  Airway Management Planned: Natural Airway  Additional Equipment:   Intra-op Plan:   Post-operative Plan:   Informed Consent: I have reviewed the patients History and Physical, chart, labs and discussed the procedure including the risks, benefits and alternatives for the proposed anesthesia with the patient or authorized representative who has indicated his/her  understanding and acceptance.   Dental advisory given  Plan Discussed with: CRNA and Anesthesiologist  Anesthesia Plan Comments:         Anesthesia Quick Evaluation

## 2015-11-08 NOTE — Transfer of Care (Signed)
Immediate Anesthesia Transfer of Care Note  Patient: Scott Hudson  Procedure(s) Performed: Procedure(s): COLONOSCOPY WITH PROPOFOL (N/A)  Patient Location: PACU  Anesthesia Type:General  Level of Consciousness: awake and alert   Airway & Oxygen Therapy: Patient Spontanous Breathing  Post-op Assessment: Report given to RN and Post -op Vital signs reviewed and stable  Post vital signs: Reviewed  Last Vitals:  Vitals:   11/08/15 0733 11/08/15 0832  BP: 129/68 (!) 99/38  Pulse: 68 62  Resp: 16 16  Temp: (!) 36.1 C (!) 35.7 C    Last Pain:  Vitals:   11/08/15 0832  TempSrc: Tympanic         Complications: No apparent anesthesia complications

## 2015-11-08 NOTE — Anesthesia Postprocedure Evaluation (Signed)
Anesthesia Post Note  Patient: OTHMAR FACTOR  Procedure(s) Performed: Procedure(s) (LRB): COLONOSCOPY WITH PROPOFOL (N/A)  Patient location during evaluation: Endoscopy Anesthesia Type: General Level of consciousness: awake and alert and oriented Pain management: pain level controlled Vital Signs Assessment: post-procedure vital signs reviewed and stable Respiratory status: spontaneous breathing, nonlabored ventilation and respiratory function stable Cardiovascular status: blood pressure returned to baseline and stable Postop Assessment: no signs of nausea or vomiting Anesthetic complications: no    Last Vitals:  Vitals:   11/08/15 0842 11/08/15 0852  BP: (!) 106/44 (!) 128/53  Pulse: (!) 54 (!) 56  Resp: 17 15  Temp:      Last Pain:  Vitals:   11/08/15 0832  TempSrc: Tympanic                 Klaus Casteneda

## 2015-11-09 ENCOUNTER — Encounter: Payer: Self-pay | Admitting: Gastroenterology

## 2015-11-09 LAB — SURGICAL PATHOLOGY

## 2015-11-10 ENCOUNTER — Encounter: Payer: Self-pay | Admitting: Gastroenterology

## 2015-12-05 ENCOUNTER — Inpatient Hospital Stay: Payer: Commercial Managed Care - PPO | Attending: Internal Medicine

## 2015-12-05 DIAGNOSIS — I10 Essential (primary) hypertension: Secondary | ICD-10-CM | POA: Diagnosis not present

## 2015-12-05 DIAGNOSIS — I739 Peripheral vascular disease, unspecified: Secondary | ICD-10-CM | POA: Insufficient documentation

## 2015-12-05 DIAGNOSIS — E7800 Pure hypercholesterolemia, unspecified: Secondary | ICD-10-CM | POA: Insufficient documentation

## 2015-12-05 DIAGNOSIS — K219 Gastro-esophageal reflux disease without esophagitis: Secondary | ICD-10-CM | POA: Insufficient documentation

## 2015-12-05 DIAGNOSIS — Z7984 Long term (current) use of oral hypoglycemic drugs: Secondary | ICD-10-CM | POA: Insufficient documentation

## 2015-12-05 DIAGNOSIS — D509 Iron deficiency anemia, unspecified: Secondary | ICD-10-CM | POA: Diagnosis not present

## 2015-12-05 DIAGNOSIS — E119 Type 2 diabetes mellitus without complications: Secondary | ICD-10-CM | POA: Diagnosis not present

## 2015-12-05 DIAGNOSIS — Z87891 Personal history of nicotine dependence: Secondary | ICD-10-CM | POA: Insufficient documentation

## 2015-12-05 DIAGNOSIS — Z79899 Other long term (current) drug therapy: Secondary | ICD-10-CM | POA: Insufficient documentation

## 2015-12-05 DIAGNOSIS — D696 Thrombocytopenia, unspecified: Secondary | ICD-10-CM | POA: Insufficient documentation

## 2015-12-05 LAB — IRON AND TIBC
Iron: 72 ug/dL (ref 45–182)
SATURATION RATIOS: 21 % (ref 17.9–39.5)
TIBC: 336 ug/dL (ref 250–450)
UIBC: 264 ug/dL

## 2015-12-05 LAB — COMPREHENSIVE METABOLIC PANEL
ALBUMIN: 3.9 g/dL (ref 3.5–5.0)
ALK PHOS: 66 U/L (ref 38–126)
ALT: 20 U/L (ref 17–63)
ANION GAP: 5 (ref 5–15)
AST: 23 U/L (ref 15–41)
BUN: 24 mg/dL — ABNORMAL HIGH (ref 6–20)
CALCIUM: 9.3 mg/dL (ref 8.9–10.3)
CO2: 27 mmol/L (ref 22–32)
Chloride: 107 mmol/L (ref 101–111)
Creatinine, Ser: 1.2 mg/dL (ref 0.61–1.24)
GFR calc Af Amer: >60 mL/min (ref >60)
GFR calc non Af Amer: 54 mL/min — ABNORMAL LOW (ref >60)
GLUCOSE: 100 mg/dL — AB (ref 65–99)
Potassium: 4.9 mmol/L (ref 3.5–5.1)
SODIUM: 139 mmol/L (ref 135–145)
Total Bilirubin: 0.4 mg/dL (ref 0.3–1.2)
Total Protein: 7.4 g/dL (ref 6.5–8.1)

## 2015-12-05 LAB — CBC WITH DIFFERENTIAL/PLATELET
BASOS ABS: 0 10*3/uL (ref 0–0.1)
BASOS PCT: 0 %
EOS ABS: 0.1 10*3/uL (ref 0–0.7)
Eosinophils Relative: 2 %
HCT: 39 % — ABNORMAL LOW (ref 40.0–52.0)
HEMOGLOBIN: 13.1 g/dL (ref 13.0–18.0)
Lymphocytes Relative: 37 %
Lymphs Abs: 2 10*3/uL (ref 1.0–3.6)
MCH: 30.5 pg (ref 26.0–34.0)
MCHC: 33.7 g/dL (ref 32.0–36.0)
MCV: 90.5 fL (ref 80.0–100.0)
MONOS PCT: 11 %
Monocytes Absolute: 0.6 10*3/uL (ref 0.2–1.0)
NEUTROS PCT: 50 %
Neutro Abs: 2.7 10*3/uL (ref 1.4–6.5)
Platelets: 158 10*3/uL (ref 150–440)
RBC: 4.31 MIL/uL — AB (ref 4.40–5.90)
RDW: 15 % — ABNORMAL HIGH (ref 11.5–14.5)
WBC: 5.4 10*3/uL (ref 3.8–10.6)

## 2015-12-05 LAB — FERRITIN: Ferritin: 35 ng/mL (ref 24–336)

## 2015-12-12 ENCOUNTER — Inpatient Hospital Stay: Payer: Commercial Managed Care - PPO

## 2015-12-12 ENCOUNTER — Inpatient Hospital Stay: Payer: Commercial Managed Care - PPO | Attending: Internal Medicine | Admitting: Internal Medicine

## 2015-12-12 ENCOUNTER — Telehealth: Payer: Self-pay

## 2015-12-12 VITALS — BP 158/64 | HR 67 | Temp 96.4°F | Wt 157.0 lb

## 2015-12-12 DIAGNOSIS — Z7984 Long term (current) use of oral hypoglycemic drugs: Secondary | ICD-10-CM | POA: Insufficient documentation

## 2015-12-12 DIAGNOSIS — E1151 Type 2 diabetes mellitus with diabetic peripheral angiopathy without gangrene: Secondary | ICD-10-CM | POA: Diagnosis not present

## 2015-12-12 DIAGNOSIS — E7800 Pure hypercholesterolemia, unspecified: Secondary | ICD-10-CM | POA: Insufficient documentation

## 2015-12-12 DIAGNOSIS — Z87891 Personal history of nicotine dependence: Secondary | ICD-10-CM | POA: Insufficient documentation

## 2015-12-12 DIAGNOSIS — Z79899 Other long term (current) drug therapy: Secondary | ICD-10-CM | POA: Diagnosis not present

## 2015-12-12 DIAGNOSIS — I10 Essential (primary) hypertension: Secondary | ICD-10-CM | POA: Diagnosis not present

## 2015-12-12 DIAGNOSIS — D500 Iron deficiency anemia secondary to blood loss (chronic): Secondary | ICD-10-CM | POA: Insufficient documentation

## 2015-12-12 DIAGNOSIS — K219 Gastro-esophageal reflux disease without esophagitis: Secondary | ICD-10-CM | POA: Insufficient documentation

## 2015-12-12 NOTE — Progress Notes (Signed)
[place] NOTE  Patient Care Team: Albina Billet, MD as PCP - General (Internal Medicine)  CHIEF COMPLAINTS/PURPOSE OF CONSULTATION:   # ANEMIA MICROCYTIC- hb 6.5/IDA- s/P ferrahem [June 2017] CT-Aab/pelvis- NEG; Colonoscopy [Dr.Wohl- small polyp s/p resection]  # MILD THROMBOCYTOPENIA  [120s]  HISTORY OF PRESENTING ILLNESS:  Scott Hudson 82 y.o.  male with severe iron deficiency anemia unclear etiology is here for follow-up. In the interim patient had a colonoscopy did not show any reason for bleeding. Patient continues to be on by mouth iron.  Patient admits to improvement of fatigue.  Denies any blood in stools. Denies any black stools. Also denies any blood in urine.  ROS: A complete 10 point review of system is done which is negative except mentioned above in history of present illness  MEDICAL HISTORY:  Past Medical History:  Diagnosis Date  . Anemia   . Diabetes mellitus without complication ([place])   . GERD (gastroesophageal reflux disease)   . Hypercholesteremia   . Hypertension   . Peripheral vascular disease ([place])   . Thrombocytopenia ([place])     SURGICAL HISTORY: Past Surgical History:  Procedure Laterality Date  . COLONOSCOPY    . COLONOSCOPY WITH PROPOFOL N/A 11/08/2015   Procedure: COLONOSCOPY WITH PROPOFOL;  Surgeon: Lucilla Lame, MD;  Location: [facility] ENDOSCOPY;  Service: Endoscopy;  Laterality: N/A;  . PERIPHERAL VASCULAR CATHETERIZATION  11/30/2014   Procedure: Lower Extremity Intervention;  Surgeon: Katha Cabal, MD;  Location: [place] [facility];  Service: Cardiovascular;;  . PERIPHERAL VASCULAR CATHETERIZATION N/A 11/30/2014   Procedure: Abdominal Aortogram w/Lower Extremity;  Surgeon: Katha Cabal, MD;  Location: [place] [facility];  Service: Cardiovascular;  Laterality: N/A;  . rt hand trama, tips of two fingers removed      SOCIAL HISTORY: Social History   Social History  . Marital status: Married    Spouse  name: N/A  . Number of children: N/A  . Years of education: N/A   Occupational History  . Not on file.   Social History Main Topics  . Smoking status: Former Smoker    Packs/day: 1.50    Years: 40.00    Types: Cigarettes    Quit date: 11/29/2013  . Smokeless tobacco: Never Used  . Alcohol use No  . Drug use: No  . Sexual activity: Not on file   Other Topics Concern  . Not on file   Social History Narrative  . No narrative on file    FAMILY HISTORY: No family history on file.  ALLERGIES:  has No Known Allergies.  MEDICATIONS:  Current Outpatient Prescriptions  Medication Sig Dispense Refill  . amLODipine (NORVASC) 10 MG tablet Take 10 mg by mouth daily.  6  . ferrous fumarate-iron polysaccharide complex (TANDEM) 162-115.2 MG CAPS capsule Take 1 capsule by mouth daily with breakfast. 30 capsule 6  . hydrALAZINE (APRESOLINE) 10 MG tablet Take 10 mg by mouth 3 (three) times daily.  5  . isosorbide mononitrate (IMDUR) 30 MG 24 hr tablet Take 30 mg by mouth daily.  5  . metFORMIN (GLUCOPHAGE) 500 MG tablet Take 500 mg by mouth daily with breakfast.    . omeprazole (PRILOSEC) 40 MG capsule Take 40 mg by mouth daily. Reported on 06/16/2015    . simvastatin (ZOCOR) 40 MG tablet Take 40 mg by mouth daily.    . tamsulosin (FLOMAX) 0.4 MG CAPS capsule Take 0.4 mg by mouth every evening.  5   No current  facility-administered medications for this visit.       Marland Kitchen  PHYSICAL EXAMINATION:   Vitals:   12/12/15 1030  BP: (!) 158/64  Pulse: 67  Temp: (!) 96.4 F (35.8 C)   Filed Weights   12/12/15 1030  Weight: 157 lb (71.2 kg)    GENERAL: Thin built moderately nourished male patient Alert, no distress and comfortable.  Accompanied by his wife. EYES: NO pallor; no icterus OROPHARYNX: no thrush or ulceration; good dentition  NECK: supple, no masses felt LYMPH:  no palpable lymphadenopathy in the cervical, axillary or inguinal regions LUNGS: clear to auscultation and  No  wheeze or crackles HEART/CVS: regular rate & rhythm and no murmurs; No lower extremity edema ABDOMEN: abdomen soft, non-tender and normal bowel sounds Musculoskeletal:no cyanosis of digits and no clubbing  PSYCH: alert & oriented x 3 with fluent speech NEURO: no focal motor/sensory deficits SKIN:  no rashes or significant lesions  LABORATORY DATA:  I have reviewed the data as listed Lab Results  Component Value Date   WBC 5.4 12/05/2015   HGB 13.1 12/05/2015   HCT 39.0 (L) 12/05/2015   MCV 90.5 12/05/2015   PLT 158 12/05/2015    Recent Labs  07/28/15 0847 09/01/15 0949 12/05/15 0908  NA 134* 134* 139  K 4.6 4.0 4.9  CL 106 106 107  CO2 23 22 27   GLUCOSE 94 80 100*  BUN 20 23* 24*  CREATININE 1.37* 1.01 1.20  CALCIUM 9.4 8.9 9.3  GFRNONAA 46* >60 54*  GFRAA 53* >60 >60  PROT 7.2 7.5 7.4  ALBUMIN 3.9 4.0 3.9  AST 28 29 23   ALT 23 22 20   ALKPHOS 78 65 66  BILITOT 0.3 0.4 0.4    RADIOGRAPHIC STUDIES: I have personally reviewed the radiological images as listed and agreed with the findings in the report. No results found.  ASSESSMENT & PLAN:   Iron deficiency anemia due to chronic blood loss Unclear etiology. Status post IV ferriheme improvement of the hemoglobin from 6.5; hemoglobin 13; iron studies- no evidence of iron deficiency. Recommend continued by mouth iron pills tolerating well.   #  s/p colonoscopy- NEGATIVE/. disucssed with Dr.Wohl that pt needs EGD. Pt reluctant. Encouraged to make an appointment.  # Pt reluctant with follow up. Patient will follow up with PCP. follow up as needed.    CC: Dr.Tate.      Cammie Sickle, MD 12/13/2015 8:11 AM

## 2015-12-12 NOTE — Telephone Encounter (Signed)
Patient stated that he is busy during the month of December and doesn't wish to schedule at this time. He will call us back.

## 2015-12-12 NOTE — Assessment & Plan Note (Addendum)
Unclear etiology. Status post IV ferriheme improvement of the hemoglobin from 6.5; hemoglobin 13; iron studies- no evidence of iron deficiency. Recommend continued by mouth iron pills tolerating well.   #  s/p colonoscopy- NEGATIVE/. disucssed with Dr.Wohl that pt needs EGD. Pt reluctant. Encouraged to make an appointment.  # Pt reluctant with follow up. Patient will follow up with PCP. follow up as needed.    CC: Dr.Tate.

## 2015-12-12 NOTE — Telephone Encounter (Signed)
LVM for pt to return my call to schedule an appt to discuss iron deficiency anemia per Dr. Rogue Bussing.

## 2015-12-12 NOTE — Progress Notes (Signed)
Patient here today for follow up on Iron deficiency anemia.

## 2016-01-25 ENCOUNTER — Ambulatory Visit (INDEPENDENT_AMBULATORY_CARE_PROVIDER_SITE_OTHER): Payer: Self-pay | Admitting: Vascular Surgery

## 2016-01-25 ENCOUNTER — Encounter (INDEPENDENT_AMBULATORY_CARE_PROVIDER_SITE_OTHER): Payer: Self-pay

## 2016-02-15 ENCOUNTER — Encounter (INDEPENDENT_AMBULATORY_CARE_PROVIDER_SITE_OTHER): Payer: Commercial Managed Care - PPO

## 2016-02-15 ENCOUNTER — Ambulatory Visit (INDEPENDENT_AMBULATORY_CARE_PROVIDER_SITE_OTHER): Payer: Self-pay | Admitting: Vascular Surgery

## 2016-03-19 ENCOUNTER — Other Ambulatory Visit (INDEPENDENT_AMBULATORY_CARE_PROVIDER_SITE_OTHER): Payer: Self-pay | Admitting: Vascular Surgery

## 2016-03-19 DIAGNOSIS — I739 Peripheral vascular disease, unspecified: Secondary | ICD-10-CM

## 2016-03-21 ENCOUNTER — Encounter (INDEPENDENT_AMBULATORY_CARE_PROVIDER_SITE_OTHER): Payer: Commercial Managed Care - PPO

## 2016-03-21 ENCOUNTER — Ambulatory Visit (INDEPENDENT_AMBULATORY_CARE_PROVIDER_SITE_OTHER): Payer: Self-pay | Admitting: Vascular Surgery

## 2016-05-02 ENCOUNTER — Ambulatory Visit (INDEPENDENT_AMBULATORY_CARE_PROVIDER_SITE_OTHER): Payer: Commercial Managed Care - PPO

## 2016-05-02 ENCOUNTER — Encounter (INDEPENDENT_AMBULATORY_CARE_PROVIDER_SITE_OTHER): Payer: Self-pay | Admitting: Vascular Surgery

## 2016-05-02 ENCOUNTER — Ambulatory Visit (INDEPENDENT_AMBULATORY_CARE_PROVIDER_SITE_OTHER): Payer: Commercial Managed Care - PPO | Admitting: Vascular Surgery

## 2016-05-02 VITALS — BP 131/56 | HR 58 | Resp 16 | Ht 68.5 in | Wt 165.0 lb

## 2016-05-02 DIAGNOSIS — E119 Type 2 diabetes mellitus without complications: Secondary | ICD-10-CM

## 2016-05-02 DIAGNOSIS — I739 Peripheral vascular disease, unspecified: Secondary | ICD-10-CM | POA: Insufficient documentation

## 2016-05-02 DIAGNOSIS — E785 Hyperlipidemia, unspecified: Secondary | ICD-10-CM | POA: Diagnosis not present

## 2016-05-02 NOTE — Progress Notes (Signed)
Subjective:    Patient ID: Scott Hudson, male    DOB: 08-05-1929, 83 y.o.   MRN: [id number] Chief Complaint  Patient presents with  . Re-evaluation    6 month ultrasound follow up   Patient presents for a six month lower extremity atherosclerotic disease follow up. The patient is s/p stent placement to the right SFA and popliteal artery on 11/30/14. The patient underwent an ABI which showed Right ABI: 1.04 and Left 0.92 (on 07/25/15, Right ABI: 0.94 and Left 0.83). The patient denies any claudication like symptoms, rest pain or ulcers to his feet / toes.    Review of Systems  Constitutional: Negative.   HENT: Negative.   Eyes: Negative.   Respiratory: Negative.   Cardiovascular: Negative.   Gastrointestinal: Negative.   Endocrine: Negative.   Genitourinary: Negative.   Musculoskeletal: Negative.   Skin: Negative.   Allergic/Immunologic: Negative.   Neurological: Negative.   Hematological: Negative.   Psychiatric/Behavioral: Negative.       Objective:   Physical Exam  Constitutional: He is oriented to person, place, and time. He appears well-developed and well-nourished. No distress.  HENT:  Head: Normocephalic and atraumatic.  Right Ear: External ear normal.  Left Ear: External ear normal.  Eyes: Conjunctivae are normal. Pupils are equal, round, and reactive to light.  Neck: Normal range of motion.  Cardiovascular: Normal rate, regular rhythm, normal heart sounds and intact distal pulses.   Pulses:      Radial pulses are 2+ on the right side, and 2+ on the left side.       Dorsalis pedis pulses are 2+ on the right side, and 2+ on the left side.       Posterior tibial pulses are 2+ on the right side, and 2+ on the left side.  Pulmonary/Chest: Effort normal.  Musculoskeletal: Normal range of motion. He exhibits no edema.  Neurological: He is alert and oriented to person, place, and time.  Skin: Skin is warm and dry. He is not diaphoretic.  Psychiatric: He has a  normal mood and affect. His behavior is normal. Judgment and thought content normal.  Vitals reviewed.  BP (!) 131/56 (BP Location: Right Arm)   Pulse (!) 58   Resp 16   Ht 5' 8.5" (1.74 m)   Wt 165 lb (74.8 kg)   BMI 24.72 kg/m   Past Medical History:  Diagnosis Date  . Anemia   . Diabetes mellitus without complication ([place])   . GERD (gastroesophageal reflux disease)   . Hypercholesteremia   . Hypertension   . Peripheral vascular disease ([place])   . Thrombocytopenia [facility])    Social History   Social History  . Marital status: Married    Spouse name: N/A  . Number of children: N/A  . Years of education: N/A   Occupational History  . Not on file.   Social History Main Topics  . Smoking status: Former Smoker    Packs/day: 1.50    Years: 40.00    Types: Cigarettes    Quit date: 11/29/2013  . Smokeless tobacco: Never Used  . Alcohol use No  . Drug use: No  . Sexual activity: Not on file   Other Topics Concern  . Not on file   Social History Narrative  . No narrative on file   Past Surgical History:  Procedure Laterality Date  . COLONOSCOPY    . COLONOSCOPY WITH PROPOFOL N/A 11/08/2015   Procedure: COLONOSCOPY WITH PROPOFOL;  Surgeon: Lucilla Lame, MD;  Location: [facility] ENDOSCOPY;  Service: Endoscopy;  Laterality: N/A;  . PERIPHERAL VASCULAR CATHETERIZATION  11/30/2014   Procedure: Lower Extremity Intervention;  Surgeon: Katha Cabal, MD;  Location: [place] [facility];  Service: Cardiovascular;;  . PERIPHERAL VASCULAR CATHETERIZATION N/A 11/30/2014   Procedure: Abdominal Aortogram w/Lower Extremity;  Surgeon: Katha Cabal, MD;  Location: [place] [facility];  Service: Cardiovascular;  Laterality: N/A;  . rt hand trama, tips of two fingers removed     No family history on file.  No Known Allergies     Assessment & Plan:  Patient presents for a six month lower extremity atherosclerotic disease follow up. The patient is s/p stent placement to the right  SFA and popliteal artery on 11/30/14. The patient underwent an ABI which showed Right ABI: 1.04 and Left 0.92 (on 07/25/15, Right ABI: 0.94 and Left 0.83). The patient denies any claudication like symptoms, rest pain or ulcers to his feet / toes.   1. PAD (peripheral artery disease) (HCC) - Stable ABI stable Patient asymptomatic. No intervention indicated at this time I have discussed with the patient at length the risk factors for and pathogenesis of atherosclerotic disease and encouraged a healthy diet, regular exercise regimen and blood pressure / glucose control.  The patient was encouraged to call the office in the interim if he experiences any claudication like symptoms, rest pain or ulcers to his feet / toes  - VAS [place] ABI WITH/WO TBI; Future  2. Diabetes mellitus type 2 in nonobese (HCC) - Stable Encouraged good control as its slows the progression of atherosclerotic disease  3. Hyperlipidemia, unspecified hyperlipidemia type - Stable Encouraged good control as its slows the progression of atherosclerotic disease  Current Outpatient Prescriptions on File Prior to Visit  Medication Sig Dispense Refill  . amLODipine (NORVASC) 10 MG tablet Take 10 mg by mouth daily.  6  . ferrous fumarate-iron polysaccharide complex (TANDEM) 162-115.2 MG CAPS capsule Take 1 capsule by mouth daily with breakfast. 30 capsule 6  . hydrALAZINE (APRESOLINE) 10 MG tablet Take 10 mg by mouth 3 (three) times daily.  5  . isosorbide mononitrate (IMDUR) 30 MG 24 hr tablet Take 30 mg by mouth daily.  5  . metFORMIN (GLUCOPHAGE) 500 MG tablet Take 500 mg by mouth daily with breakfast.    . omeprazole (PRILOSEC) 40 MG capsule Take 40 mg by mouth daily. Reported on 06/16/2015    . simvastatin (ZOCOR) 40 MG tablet Take 40 mg by mouth daily.    . tamsulosin (FLOMAX) 0.4 MG CAPS capsule Take 0.4 mg by mouth every evening.  5   No current facility-administered medications on file prior to visit.     There are no  Patient Instructions on file for this visit. No Follow-up on file.   KIMBERLY A STEGMAYER, PA-C

## 2016-05-12 ENCOUNTER — Other Ambulatory Visit: Payer: Self-pay | Admitting: Internal Medicine

## 2016-11-01 ENCOUNTER — Ambulatory Visit (INDEPENDENT_AMBULATORY_CARE_PROVIDER_SITE_OTHER): Payer: Commercial Managed Care - PPO | Admitting: Vascular Surgery

## 2016-11-01 ENCOUNTER — Ambulatory Visit (INDEPENDENT_AMBULATORY_CARE_PROVIDER_SITE_OTHER): Payer: Commercial Managed Care - PPO

## 2016-11-01 ENCOUNTER — Encounter (INDEPENDENT_AMBULATORY_CARE_PROVIDER_SITE_OTHER): Payer: Self-pay | Admitting: Vascular Surgery

## 2016-11-01 VITALS — BP 140/62 | HR 68 | Resp 16 | Wt 154.0 lb

## 2016-11-01 DIAGNOSIS — E785 Hyperlipidemia, unspecified: Secondary | ICD-10-CM

## 2016-11-01 DIAGNOSIS — K219 Gastro-esophageal reflux disease without esophagitis: Secondary | ICD-10-CM

## 2016-11-01 DIAGNOSIS — I739 Peripheral vascular disease, unspecified: Secondary | ICD-10-CM

## 2016-11-01 DIAGNOSIS — E119 Type 2 diabetes mellitus without complications: Secondary | ICD-10-CM | POA: Diagnosis not present

## 2016-11-01 NOTE — Progress Notes (Signed)
MRN : [id number]  Scott Hudson is a 83 y.o. (Jan 25, 1929) male who presents with chief complaint of  Chief Complaint  Patient presents with  . Follow-up    26mo abi  .  History of Present Illness:  The patient returns to the office for followup and review status post angiogram with intervention.  Date of Surgery: 11/30/2014  Diagnosis: Atherosclerotic occlusive disease bilateral lower extremities with rest pain right lower extremity  Procedure(s) Performed: 1. Introduction catheter into right lower extremity 3rd order catheter placement  2. Contrast injection right lower extremity for distal runoff   3. Percutaneous transluminal angioplasty and stent placement right superficial femoral artery and popliteal 4. Star close closure left common femoral arteriotomy   The patient notes improvement in the lower extremity symptoms. No interval shortening of the patient's claudication distance or rest pain symptoms. Previous wounds have now healed.  No new ulcers or wounds have occurred since the last visit.  There have been no significant changes to the patient's overall health care.  The patient denies amaurosis fugax or recent TIA symptoms. There are no recent neurological changes noted. The patient denies history of DVT, PE or superficial thrombophlebitis. The patient denies recent episodes of angina or shortness of breath.     Current Meds  Medication Sig  . amLODipine (NORVASC) 10 MG tablet Take 10 mg by mouth daily.  . ferrous fumarate-iron polysaccharide complex (TANDEM) 162-115.2 MG CAPS capsule Take 1 capsule by mouth daily with breakfast.  . hydrALAZINE (APRESOLINE) 10 MG tablet Take 10 mg by mouth 3 (three) times daily.  . isosorbide mononitrate (IMDUR) 30 MG 24 hr tablet Take 30 mg by mouth daily.  . metFORMIN (GLUCOPHAGE) 500 MG tablet Take 500 mg by mouth daily with breakfast.  . omeprazole (PRILOSEC) 40 MG  capsule Take 40 mg by mouth daily. Reported on 06/16/2015  . simvastatin (ZOCOR) 40 MG tablet Take 40 mg by mouth daily.  . tamsulosin (FLOMAX) 0.4 MG CAPS capsule Take 0.4 mg by mouth every evening.    Past Medical History:  Diagnosis Date  . Anemia   . Diabetes mellitus without complication ([place])   . GERD (gastroesophageal reflux disease)   . Hypercholesteremia   . Hypertension   . Peripheral vascular disease ([place])   . Thrombocytopenia ([place])     Past Surgical History:  Procedure Laterality Date  . COLONOSCOPY    . COLONOSCOPY WITH PROPOFOL N/A 11/08/2015   Procedure: COLONOSCOPY WITH PROPOFOL;  Surgeon: Lucilla Lame, MD;  Location: [facility] ENDOSCOPY;  Service: Endoscopy;  Laterality: N/A;  . PERIPHERAL VASCULAR CATHETERIZATION  11/30/2014   Procedure: Lower Extremity Intervention;  Surgeon: Katha Cabal, MD;  Location: [place] [facility];  Service: Cardiovascular;;  . PERIPHERAL VASCULAR CATHETERIZATION N/A 11/30/2014   Procedure: Abdominal Aortogram w/Lower Extremity;  Surgeon: Katha Cabal, MD;  Location: [place] [facility];  Service: Cardiovascular;  Laterality: N/A;  . rt hand trama, tips of two fingers removed      Social History Social History  Substance Use Topics  . Smoking status: Former Smoker    Packs/day: 1.50    Years: 40.00    Types: Cigarettes    Quit date: 11/29/2013  . Smokeless tobacco: Never Used  . Alcohol use No    Family History No family history on file.  No Known Allergies   REVIEW OF SYSTEMS (Negative unless checked)  Constitutional: [] Weight loss  [] Fever  [] Chills Cardiac: [] Chest pain   [] Chest pressure   []   Palpitations   [] Shortness of breath when laying flat   [] Shortness of breath with exertion. Vascular:  [x] Pain in legs with walking   [] Pain in legs at rest  [] History of DVT   [] Phlebitis   [] Swelling in legs   [] Varicose veins   [] Non-healing ulcers Pulmonary:   [] Uses home oxygen   [] Productive cough   [] Hemoptysis    [] Wheeze  [] COPD   [] Asthma Neurologic:  [] Dizziness   [] Seizures   [] History of stroke   [] History of TIA  [] Aphasia   [] Vissual changes   [] Weakness or numbness in arm   [] Weakness or numbness in leg Musculoskeletal:   [] Joint swelling   [] Joint pain   [] Low back pain Hematologic:  [] Easy bruising  [] Easy bleeding   [] Hypercoagulable state   [] Anemic Gastrointestinal:  [] Diarrhea   [] Vomiting  [] Gastroesophageal reflux/heartburn   [] Difficulty swallowing. Genitourinary:  [] Chronic kidney disease   [] Difficult urination  [] Frequent urination   [] Blood in urine Skin:  [] Rashes   [] Ulcers  Psychological:  [] History of anxiety   []  History of major depression.  Physical Examination  Vitals:   11/01/16 1009  BP: 140/62  Pulse: 68  Resp: 16  Weight: 154 lb (69.9 kg)   Body mass index is 23.08 kg/m. Gen: WD/WN, NAD Head: [place]/AT, No temporalis wasting.  Ear/Nose/Throat: Hearing grossly intact, nares w/o erythema or drainage Eyes: PER, EOMI, sclera nonicteric.  Neck: Supple, no large masses.   Pulmonary:  Good air movement, no audible wheezing bilaterally, no use of accessory muscles.  Cardiac: RRR, no JVD Vascular:  Vessel Right Left  Radial Palpable Palpable  Popliteal Not Palpable Not Palpable  PT Not Palpable Not Palpable  DP Not Palpable Not Palpable  Gastrointestinal: Non-distended. No guarding/no peritoneal signs.  Musculoskeletal: M/S 5/5 throughout.  No deformity or atrophy.  Neurologic: CN 2-12 intact. Symmetrical.  Speech is fluent. Motor exam as listed above. Psychiatric: Judgment intact, Mood & affect appropriate for pt's clinical situation. Dermatologic: No rashes or ulcers noted.  No changes consistent with cellulitis. Lymph : No lichenification or skin changes of chronic lymphedema.  CBC Lab Results  Component Value Date   WBC 5.4 12/05/2015   HGB 13.1 12/05/2015   HCT 39.0 (L) 12/05/2015   MCV 90.5 12/05/2015   PLT 158 12/05/2015    BMET    Component Value  Date/Time   NA 139 12/05/2015 0908   NA 141 07/26/2011 2004   K 4.9 12/05/2015 0908   K 4.5 07/26/2011 2004   CL 107 12/05/2015 0908   CL 108 (H) 07/26/2011 2004   CO2 27 12/05/2015 0908   CO2 25 07/26/2011 2004   GLUCOSE 100 (H) 12/05/2015 0908   GLUCOSE 99 07/26/2011 2004   BUN 24 (H) 12/05/2015 0908   BUN 18 07/26/2011 2004   CREATININE 1.20 12/05/2015 0908   CREATININE 1.22 07/26/2011 2004   CALCIUM 9.3 12/05/2015 0908   CALCIUM 9.0 07/26/2011 2004   GFRNONAA 54 (L) 12/05/2015 0908   GFRNONAA 56 (L) 07/26/2011 2004   GFRAA >60 12/05/2015 0908   GFRAA >60 07/26/2011 2004   CrCl cannot be calculated (Patient's most recent lab result is older than the maximum 21 days allowed.).  COAG No results found for: INR, PROTIME  Radiology No results found.  Assessment/Plan 1. PAD (peripheral artery disease) (HCC)  Recommend:  The patient has evidence of atherosclerosis of the lower extremities with claudication.  The patient does not voice lifestyle limiting changes at this point in time.  Noninvasive  studies do not suggest clinically significant change.  No invasive studies, angiography or surgery at this time The patient should continue walking and begin a more formal exercise program.  The patient should continue antiplatelet therapy and aggressive treatment of the lipid abnormalities  No changes in the patient's medications at this time  The patient should continue wearing graduated compression socks 10-15 mmHg strength to control the mild edema.   - VAS [place] ABI WITH/WO TBI; Future - VAS [place] LOWER EXTREMITY ARTERIAL DUPLEX; Future  2. Diabetes mellitus type 2 in nonobese [facility]) Continue hypoglycemic medications as already ordered, these medications have been reviewed and there are no changes at this time.  Hgb A1C to be monitored as already arranged by primary service   3. Hyperlipidemia, unspecified hyperlipidemia type Continue statin as ordered and reviewed, no  changes at this time   4. Gastroesophageal reflux disease without esophagitis Continue PPI as already ordered, these medications have been reviewed and there are no changes at this time.    Hortencia Pilar, MD  11/01/2016 10:45 AM

## 2016-11-04 DIAGNOSIS — K219 Gastro-esophageal reflux disease without esophagitis: Secondary | ICD-10-CM | POA: Insufficient documentation

## 2016-12-17 ENCOUNTER — Ambulatory Visit: Payer: Commercial Managed Care - PPO | Admitting: Podiatry

## 2016-12-25 ENCOUNTER — Ambulatory Visit (INDEPENDENT_AMBULATORY_CARE_PROVIDER_SITE_OTHER): Payer: Commercial Managed Care - PPO | Admitting: Podiatry

## 2016-12-25 DIAGNOSIS — L989 Disorder of the skin and subcutaneous tissue, unspecified: Secondary | ICD-10-CM

## 2016-12-27 NOTE — Progress Notes (Signed)
   Subjective: 83 year old male with PMHx of diabetes mellitus presents to the office today for chief complaint of painful callus lesions of bilateral second toes that appeared about one month ago. He is concerned for possible ingrown nails and fungus. Wearing shoes and ambulation increases the pain. He has been using an OTC fungal liquid with mild relief. Patient presents today for further treatment and evaluation.   Past Medical History:  Diagnosis Date  . Anemia   . Diabetes mellitus without complication ([place])   . GERD (gastroesophageal reflux disease)   . Hypercholesteremia   . Hypertension   . Peripheral vascular disease ([place])   . Thrombocytopenia ([place])     Objective:  Physical Exam General: Alert and oriented x3 in no acute distress  Dermatology: Hyperkeratotic lesion present on the bilateral 2nd toes. Pain on palpation with a central nucleated core noted.  Skin is warm, dry and supple bilateral lower extremities. Negative for open lesions or macerations.  Vascular: Palpable pedal pulses bilaterally. No edema or erythema noted. Capillary refill within normal limits.  Neurological: Epicritic and protective threshold diminished bilaterally.   Musculoskeletal Exam: Pain on palpation at the keratotic lesion noted. Range of motion within normal limits bilateral. Muscle strength 5/5 in all groups bilateral.  Assessment: #1 Diabetes mellitus w/ peripheral neuropathy #2 Porokeratotic callus lesions bilateral 2nd toes #3 Pain in feet   Plan of Care:  #1 Patient evaluated #2 Excisional debridement of keratotic lesion using a chisel blade was performed without incident.  #3 Dressed area with light dressing. #4 Silicone toe caps dispensed.  #5 Recommended good shoe gear. #6 Patient is to return to the clinic PRN.    Edrick Kins, DPM [facility]  Dr. Edrick Kins, [place]                                        [place], [place] [address]                 Office [phone]  Fax [phone]

## 2017-02-07 ENCOUNTER — Encounter (INDEPENDENT_AMBULATORY_CARE_PROVIDER_SITE_OTHER): Payer: Commercial Managed Care - PPO

## 2017-02-07 ENCOUNTER — Ambulatory Visit (INDEPENDENT_AMBULATORY_CARE_PROVIDER_SITE_OTHER): Payer: Commercial Managed Care - PPO | Admitting: Vascular Surgery

## 2017-03-07 ENCOUNTER — Ambulatory Visit (INDEPENDENT_AMBULATORY_CARE_PROVIDER_SITE_OTHER): Payer: Commercial Managed Care - PPO | Admitting: Vascular Surgery

## 2017-03-07 ENCOUNTER — Ambulatory Visit (INDEPENDENT_AMBULATORY_CARE_PROVIDER_SITE_OTHER): Payer: Commercial Managed Care - PPO

## 2017-03-07 ENCOUNTER — Encounter (INDEPENDENT_AMBULATORY_CARE_PROVIDER_SITE_OTHER): Payer: Self-pay | Admitting: Vascular Surgery

## 2017-03-07 VITALS — BP 149/65 | HR 63 | Resp 16 | Ht 68.0 in | Wt 148.0 lb

## 2017-03-07 DIAGNOSIS — E785 Hyperlipidemia, unspecified: Secondary | ICD-10-CM | POA: Diagnosis not present

## 2017-03-07 DIAGNOSIS — I739 Peripheral vascular disease, unspecified: Secondary | ICD-10-CM | POA: Diagnosis not present

## 2017-03-07 DIAGNOSIS — K219 Gastro-esophageal reflux disease without esophagitis: Secondary | ICD-10-CM | POA: Diagnosis not present

## 2017-03-07 DIAGNOSIS — E119 Type 2 diabetes mellitus without complications: Secondary | ICD-10-CM

## 2017-03-07 NOTE — Progress Notes (Signed)
MRN : [id number]  Scott Hudson is a 84 y.o. (03/05/1929) male who presents with chief complaint of No chief complaint on file. Marland Kitchen  History of Present Illness: The patient returns to the office for followup and review of the noninvasive studies.   Procedure(s) Performed 11/30/2014: 1. Introduction catheter into right lower extremity 3rd order catheter placement  2.Contrast injection right lower extremity for distal runoff  3. Percutaneous transluminal angioplasty and stent placement right superficial femoral artery and popliteal 4. Star close closure left common femoral arteriotomy  There have been no interval changes in lower extremity symptoms. No interval shortening of the patient's claudication distance or development of rest pain symptoms. No new ulcers or wounds have occurred since the last visit.  There have been no significant changes to the patient's overall health care.  The patient denies amaurosis fugax or recent TIA symptoms. There are no recent neurological changes noted. The patient denies history of DVT, PE or superficial thrombophlebitis. The patient denies recent episodes of angina or shortness of breath.   ABI Rt=1.01 and Lt=0.88   Duplex ultrasound of the lower extremities; right SFA stent is patent (60-79% stenosis of the profunda femoris) and 60-79% stenosis of the proximal left SFA  No outpatient medications have been marked as taking for the 03/07/17 encounter (Appointment) with Delana Meyer, Dolores Lory, MD.    Past Medical History:  Diagnosis Date  . Anemia   . Diabetes mellitus without complication ([place])   . GERD (gastroesophageal reflux disease)   . Hypercholesteremia   . Hypertension   . Peripheral vascular disease ([place])   . Thrombocytopenia ([place])     Past Surgical History:  Procedure Laterality Date  . COLONOSCOPY    . COLONOSCOPY WITH PROPOFOL N/A 11/08/2015   Procedure: COLONOSCOPY WITH  PROPOFOL;  Surgeon: Lucilla Lame, MD;  Location: [facility] ENDOSCOPY;  Service: Endoscopy;  Laterality: N/A;  . PERIPHERAL VASCULAR CATHETERIZATION  11/30/2014   Procedure: Lower Extremity Intervention;  Surgeon: Katha Cabal, MD;  Location: [place] [facility];  Service: Cardiovascular;;  . PERIPHERAL VASCULAR CATHETERIZATION N/A 11/30/2014   Procedure: Abdominal Aortogram w/Lower Extremity;  Surgeon: Katha Cabal, MD;  Location: [place] [facility];  Service: Cardiovascular;  Laterality: N/A;  . rt hand trama, tips of two fingers removed      Social History Social History   Tobacco Use  . Smoking status: Former Smoker    Packs/day: 1.50    Years: 40.00    Pack years: 60.00    Types: Cigarettes    Last attempt to quit: 11/29/2013    Years since quitting: 3.2  . Smokeless tobacco: Never Used  Substance Use Topics  . Alcohol use: No  . Drug use: No    Family History No family history on file.  No Known Allergies   REVIEW OF SYSTEMS (Negative unless checked)  Constitutional: [] Weight loss  [] Fever  [] Chills Cardiac: [] Chest pain   [] Chest pressure   [] Palpitations   [] Shortness of breath when laying flat   [] Shortness of breath with exertion. Vascular:  [] Pain in legs with walking   [] Pain in legs at rest  [] History of DVT   [] Phlebitis   [] Swelling in legs   [] Varicose veins   [] Non-healing ulcers Pulmonary:   [] Uses home oxygen   [] Productive cough   [] Hemoptysis   [] Wheeze  [] COPD   [] Asthma Neurologic:  [] Dizziness   [] Seizures   [] History of stroke   [] History of TIA  [] Aphasia   [] Vissual  changes   [] Weakness or numbness in arm   [] Weakness or numbness in leg Musculoskeletal:   [] Joint swelling   [] Joint pain   [] Low back pain Hematologic:  [] Easy bruising  [] Easy bleeding   [] Hypercoagulable state   [] Anemic Gastrointestinal:  [] Diarrhea   [] Vomiting  [] Gastroesophageal reflux/heartburn   [] Difficulty swallowing. Genitourinary:  [] Chronic kidney disease    [] Difficult urination  [] Frequent urination   [] Blood in urine Skin:  [] Rashes   [] Ulcers  Psychological:  [] History of anxiety   []  History of major depression.  Physical Examination  There were no vitals filed for this visit. There is no height or weight on file to calculate BMI. Gen: WD/WN, NAD Head: [place]/AT, No temporalis wasting.  Ear/Nose/Throat: Hearing grossly intact, nares w/o erythema or drainage Eyes: PER, EOMI, sclera nonicteric.  Neck: Supple, no large masses.   Pulmonary:  Good air movement, no audible wheezing bilaterally, no use of accessory muscles.  Cardiac: RRR, no JVD Vascular:  Vessel Right Left  Radial Palpable Palpable  PT Not Palpable Not Palpable  DP Not Palpable Not Palpable  Gastrointestinal: Non-distended. No guarding/no peritoneal signs.  Musculoskeletal: M/S 5/5 throughout.  No deformity or atrophy.  Neurologic: CN 2-12 intact. Symmetrical.  Speech is fluent. Motor exam as listed above. Psychiatric: Judgment intact, Mood & affect appropriate for pt's clinical situation. Dermatologic: No rashes or ulcers noted.  No changes consistent with cellulitis. Lymph : No lichenification or skin changes of chronic lymphedema.  CBC Lab Results  Component Value Date   WBC 5.4 12/05/2015   HGB 13.1 12/05/2015   HCT 39.0 (L) 12/05/2015   MCV 90.5 12/05/2015   PLT 158 12/05/2015    BMET    Component Value Date/Time   NA 139 12/05/2015 0908   NA 141 07/26/2011 2004   K 4.9 12/05/2015 0908   K 4.5 07/26/2011 2004   CL 107 12/05/2015 0908   CL 108 (H) 07/26/2011 2004   CO2 27 12/05/2015 0908   CO2 25 07/26/2011 2004   GLUCOSE 100 (H) 12/05/2015 0908   GLUCOSE 99 07/26/2011 2004   BUN 24 (H) 12/05/2015 0908   BUN 18 07/26/2011 2004   CREATININE 1.20 12/05/2015 0908   CREATININE 1.22 07/26/2011 2004   CALCIUM 9.3 12/05/2015 0908   CALCIUM 9.0 07/26/2011 2004   GFRNONAA 54 (L) 12/05/2015 0908   GFRNONAA 56 (L) 07/26/2011 2004   GFRAA >60 12/05/2015 0908     GFRAA >60 07/26/2011 2004   CrCl cannot be calculated (Patient's most recent lab result is older than the maximum 21 days allowed.).  COAG No results found for: INR, PROTIME  Radiology No results found.  Assessment/Plan 1. PAD (peripheral artery disease) (HCC) Recommend:  The patient is status post successful angiogram with intervention.  The patient reports that the claudication symptoms and leg pain is essentially gone.   The patient denies lifestyle limiting changes at this point in time.  No further invasive studies, angiography or surgery at this time The patient should continue walking and begin a more formal exercise program.  The patient should continue antiplatelet therapy and aggressive treatment of the lipid abnormalities  Smoking cessation was again discussed  The patient should continue wearing graduated compression socks 10-15 mmHg strength to control the mild edema.  Patient should undergo noninvasive studies as ordered. The patient will follow up with me after the studies.    2. Gastroesophageal reflux disease without esophagitis Continue antihypertensive medications as already ordered, these medications have been reviewed and  there are no changes at this time.  Avoidence of caffeine and alcohol  Moderate elevation of the head of the bed   3. Diabetes mellitus type 2 in nonobese [facility]) Continue hypoglycemic medications as already ordered, these medications have been reviewed and there are no changes at this time.  Hgb A1C to be monitored as already arranged by primary service   4. Hyperlipidemia, unspecified hyperlipidemia type Continue statin as ordered and reviewed, no changes at this time     Hortencia Pilar, MD  03/07/2017 8:42 AM

## 2017-09-02 ENCOUNTER — Ambulatory Visit (INDEPENDENT_AMBULATORY_CARE_PROVIDER_SITE_OTHER): Payer: Commercial Managed Care - PPO | Admitting: Vascular Surgery

## 2017-09-02 ENCOUNTER — Encounter (INDEPENDENT_AMBULATORY_CARE_PROVIDER_SITE_OTHER): Payer: Commercial Managed Care - PPO

## 2017-09-26 ENCOUNTER — Encounter: Payer: Self-pay | Admitting: Podiatry

## 2017-09-26 ENCOUNTER — Ambulatory Visit (INDEPENDENT_AMBULATORY_CARE_PROVIDER_SITE_OTHER): Payer: Commercial Managed Care - PPO | Admitting: Podiatry

## 2017-09-26 DIAGNOSIS — B351 Tinea unguium: Secondary | ICD-10-CM | POA: Diagnosis not present

## 2017-09-26 DIAGNOSIS — L989 Disorder of the skin and subcutaneous tissue, unspecified: Secondary | ICD-10-CM

## 2017-09-26 DIAGNOSIS — E1142 Type 2 diabetes mellitus with diabetic polyneuropathy: Secondary | ICD-10-CM

## 2017-09-26 DIAGNOSIS — M79675 Pain in left toe(s): Secondary | ICD-10-CM

## 2017-09-26 DIAGNOSIS — L84 Corns and callosities: Secondary | ICD-10-CM | POA: Diagnosis not present

## 2017-09-26 DIAGNOSIS — M79674 Pain in right toe(s): Secondary | ICD-10-CM

## 2017-09-26 NOTE — Progress Notes (Signed)
This patient presents the office with chief complaint of a painful corn on the tip of his second toe right foot.  He says that this calluses painful at times and experiences sharp shooting pains through the toe.  This patient has not been seen for over 9 months.  He says this corn is painful walking and wearing his shoes.  He also has thick disfigured discolored toenails, both feet.  He presents the office today for an evaluation and treatment of these  painful nails.  This patient is diabetic with neuropathy.  General Appearance  Alert, conversant and in no acute stress.  Vascular  Dorsalis pedis and posterior tibial  pulses are palpable  bilaterally.  Capillary return is within normal limits  bilaterally. Temperature is within normal limits  bilaterally.  Neurologic  Senn-Weinstein monofilament wire test diminished   bilaterally. Muscle power within normal limits bilaterally.  Nails Thick disfigured discolored nails with subungual debris  from hallux to fifth toes bilaterally. No evidence of bacterial infection or drainage bilaterally.  Orthopedic  No limitations of motion  feet .  No crepitus or effusions noted.  No bony pathology or digital deformities noted.  Skin  normotropic skin with no porokeratosis noted bilaterally.  No signs of infections or ulcers noted.  Distal clavi noted second toe right foot.  Clavi second toe right.  Onychomycosis  B/l      Debride nails  X 10.  Debride clavi second toe right foot.   Gardiner Barefoot DPM

## 2017-10-10 ENCOUNTER — Ambulatory Visit (INDEPENDENT_AMBULATORY_CARE_PROVIDER_SITE_OTHER): Payer: Commercial Managed Care - PPO | Admitting: Vascular Surgery

## 2017-10-10 ENCOUNTER — Encounter (INDEPENDENT_AMBULATORY_CARE_PROVIDER_SITE_OTHER): Payer: Commercial Managed Care - PPO

## 2017-12-02 ENCOUNTER — Other Ambulatory Visit: Payer: Self-pay | Admitting: Cardiology

## 2017-12-02 DIAGNOSIS — I10 Essential (primary) hypertension: Secondary | ICD-10-CM | POA: Diagnosis present

## 2017-12-02 DIAGNOSIS — I200 Unstable angina: Secondary | ICD-10-CM

## 2017-12-03 ENCOUNTER — Other Ambulatory Visit: Payer: Self-pay

## 2017-12-03 ENCOUNTER — Encounter: Payer: Self-pay | Admitting: *Deleted

## 2017-12-03 ENCOUNTER — Ambulatory Visit
Admission: RE | Admit: 2017-12-03 | Discharge: 2017-12-03 | Disposition: A | Discharge status: Home / Self Care | Payer: Commercial Managed Care - PPO | Source: Ambulatory Visit | Attending: Cardiology | Admitting: Cardiology

## 2017-12-03 ENCOUNTER — Encounter
Admission: RE | Disposition: A | Discharge status: Home / Self Care | Payer: Self-pay | Source: Ambulatory Visit | Attending: Cardiology

## 2017-12-03 DIAGNOSIS — R079 Chest pain, unspecified: Secondary | ICD-10-CM | POA: Diagnosis not present

## 2017-12-03 DIAGNOSIS — Z87891 Personal history of nicotine dependence: Secondary | ICD-10-CM | POA: Diagnosis not present

## 2017-12-03 DIAGNOSIS — R200 Anesthesia of skin: Secondary | ICD-10-CM | POA: Insufficient documentation

## 2017-12-03 DIAGNOSIS — D649 Anemia, unspecified: Secondary | ICD-10-CM | POA: Diagnosis not present

## 2017-12-03 DIAGNOSIS — R0602 Shortness of breath: Secondary | ICD-10-CM | POA: Diagnosis not present

## 2017-12-03 DIAGNOSIS — Z9889 Other specified postprocedural states: Secondary | ICD-10-CM | POA: Insufficient documentation

## 2017-12-03 DIAGNOSIS — E785 Hyperlipidemia, unspecified: Secondary | ICD-10-CM | POA: Diagnosis not present

## 2017-12-03 DIAGNOSIS — E1151 Type 2 diabetes mellitus with diabetic peripheral angiopathy without gangrene: Secondary | ICD-10-CM | POA: Diagnosis not present

## 2017-12-03 DIAGNOSIS — K219 Gastro-esophageal reflux disease without esophagitis: Secondary | ICD-10-CM | POA: Diagnosis not present

## 2017-12-03 DIAGNOSIS — I272 Pulmonary hypertension, unspecified: Secondary | ICD-10-CM | POA: Insufficient documentation

## 2017-12-03 DIAGNOSIS — R9439 Abnormal result of other cardiovascular function study: Secondary | ICD-10-CM | POA: Insufficient documentation

## 2017-12-03 DIAGNOSIS — I10 Essential (primary) hypertension: Secondary | ICD-10-CM | POA: Insufficient documentation

## 2017-12-03 DIAGNOSIS — Z79899 Other long term (current) drug therapy: Secondary | ICD-10-CM | POA: Diagnosis not present

## 2017-12-03 HISTORY — PX: LEFT HEART CATH AND CORONARY ANGIOGRAPHY: CATH118249

## 2017-12-03 LAB — GLUCOSE, CAPILLARY: GLUCOSE-CAPILLARY: 81 mg/dL (ref 70–99)

## 2017-12-03 SURGERY — LEFT HEART CATH AND CORONARY ANGIOGRAPHY
Anesthesia: Moderate Sedation | Laterality: Left

## 2017-12-03 MED ORDER — HEPARIN (PORCINE) IN NACL 1000-0.9 UT/500ML-% IV SOLN
INTRAVENOUS | Status: AC
  Filled 2017-12-03: qty 1000

## 2017-12-03 MED ORDER — HEPARIN SODIUM (PORCINE) 1000 UNIT/ML IJ SOLN
INTRAMUSCULAR | Status: DC | PRN
  Administered 2017-12-03: 3500 [IU] via INTRAVENOUS

## 2017-12-03 MED ORDER — SODIUM CHLORIDE 0.9 % WEIGHT BASED INFUSION: 1.0000 mL/kg/h | INTRAVENOUS | Status: DC

## 2017-12-03 MED ORDER — SODIUM CHLORIDE 0.9 % IV SOLN: 250.0000 mL | INTRAVENOUS | Status: DC | PRN

## 2017-12-03 MED ORDER — SODIUM CHLORIDE 0.9% FLUSH: 3.0000 mL | Freq: Two times a day (BID) | INTRAVENOUS | Status: DC

## 2017-12-03 MED ORDER — ONDANSETRON HCL 4 MG/2ML IJ SOLN: 4.0000 mg | Freq: Four times a day (QID) | INTRAMUSCULAR | Status: DC | PRN

## 2017-12-03 MED ORDER — FENTANYL CITRATE (PF) 100 MCG/2ML IJ SOLN
INTRAMUSCULAR | Status: DC | PRN
  Administered 2017-12-03 (×2): 25 ug via INTRAVENOUS

## 2017-12-03 MED ORDER — MIDAZOLAM HCL 2 MG/2ML IJ SOLN
INTRAMUSCULAR | Status: AC
  Filled 2017-12-03: qty 2

## 2017-12-03 MED ORDER — FENTANYL CITRATE (PF) 100 MCG/2ML IJ SOLN
INTRAMUSCULAR | Status: AC
  Filled 2017-12-03: qty 2

## 2017-12-03 MED ORDER — SODIUM CHLORIDE 0.9% FLUSH: 3.0000 mL | INTRAVENOUS | Status: DC | PRN

## 2017-12-03 MED ORDER — ASPIRIN 81 MG PO CHEW
CHEWABLE_TABLET | ORAL | Status: DC | PRN
  Administered 2017-12-03: 81 mg via ORAL

## 2017-12-03 MED ORDER — SODIUM CHLORIDE 0.9 % WEIGHT BASED INFUSION
3.0000 mL/kg/h | INTRAVENOUS | Status: AC
  Administered 2017-12-03: 3 mL/kg/h via INTRAVENOUS

## 2017-12-03 MED ORDER — ACETAMINOPHEN 325 MG PO TABS: 650.0000 mg | ORAL_TABLET | ORAL | Status: DC | PRN

## 2017-12-03 MED ORDER — HEPARIN SODIUM (PORCINE) 1000 UNIT/ML IJ SOLN
INTRAMUSCULAR | Status: AC
  Filled 2017-12-03: qty 1

## 2017-12-03 MED ORDER — ASPIRIN 81 MG PO CHEW: 81.0000 mg | CHEWABLE_TABLET | ORAL | Status: DC

## 2017-12-03 MED ORDER — ASPIRIN 81 MG PO CHEW
CHEWABLE_TABLET | ORAL | Status: AC
  Filled 2017-12-03: qty 1

## 2017-12-03 MED ORDER — IOPAMIDOL (ISOVUE-300) INJECTION 61%
INTRAVENOUS | Status: DC | PRN
  Administered 2017-12-03: 105 mL via INTRA_ARTERIAL

## 2017-12-03 MED ORDER — VERAPAMIL HCL 2.5 MG/ML IV SOLN
INTRAVENOUS | Status: AC
  Filled 2017-12-03: qty 2

## 2017-12-03 MED ORDER — MIDAZOLAM HCL 2 MG/2ML IJ SOLN
INTRAMUSCULAR | Status: DC | PRN
  Administered 2017-12-03: 1 mg via INTRAVENOUS

## 2017-12-03 MED ORDER — VERAPAMIL HCL 2.5 MG/ML IV SOLN
INTRAVENOUS | Status: DC | PRN
  Administered 2017-12-03: 2.5 mg via INTRAVENOUS

## 2017-12-03 SURGICAL SUPPLY — 10 items
CATH INFINITI 5 FR JL3.5 (CATHETERS) ×3 IMPLANT
CATH INFINITI JR4 5F (CATHETERS) ×3 IMPLANT
CATH OPTITORQUE JACKY 4.0 5F (CATHETERS) ×3 IMPLANT
DEVICE RAD TR BAND REGULAR (VASCULAR PRODUCTS) ×3 IMPLANT
GLIDESHEATH SLEND A-KIT 6F 22G (SHEATH) ×3 IMPLANT
GLIDESHEATH SLEND SS 6F .021 (SHEATH) IMPLANT
KIT MANI 3VAL PERCEP (MISCELLANEOUS) ×3 IMPLANT
PACK CARDIAC CATH (CUSTOM PROCEDURE TRAY) ×3 IMPLANT
SHEATH GLIDE SLENDER 4/5FR (SHEATH) IMPLANT
WIRE ROSEN-J .035X260CM (WIRE) ×3 IMPLANT

## 2017-12-03 NOTE — H&P (Signed)
Chief Complaint: Chief Complaint  Patient presents with  . Follow-up  Echo and Stress  Date of Service: 12/02/2017 Date of Birth: 26-Apr-1929 PCP: Albina Billet, MD  History of Present Illness: Scott Hudson is a 84 y.o.male patient with a past medical history significant for type 2 diabetes, hypertension, hyperlipidemia, and peripheral vascular disease who presents to review stress test and echocardiogram results. Since he was last seen in the office, Scott Hudson admits to left-sided chest and epigastric discomfort that can occur with rest or activity. Associated left sided arm numbness and shortness of breath. Has a history of peripheral vascular disease and underwent percutaneous transluminal angioplasty and stent placement of the right superficial femoral and popliteal arteries on 11/30/14. Admits to claudication symptoms since procedure, but follows up with Dr. Delana Meyer on a regular basis. Denies orthopnea. Denies recent falls/syncopal episodes. Former tobacco user but quit approximately 3 years ago. Followed by Dr. Hall Busing - most recent LDL was 106, A1C was 6.6, and creatinine was 1.35, GFR 55.   Stress test revealed evidence of inferior ischemia. Echocardiogram revealed normal LV function with an estimated EF of 55-60% with moderate pulmonary hypertension, moderate TR, and mild MR.   Past Medical and Surgical History  Past Medical History Past Medical History:  Diagnosis Date  . Anemia  . Diabetes mellitus type 2, uncomplicated (CMS-HCC)  . GERD (gastroesophageal reflux disease)  . Hyperlipidemia  . Hypertension  . Peripheral vascular disease (CMS-HCC)  . Thrombocytopenia (CMS-HCC)   Past Surgical History He has a past surgical history that includes peripheral vascular catheterization.   Medications and Allergies  Current Medications  Current Outpatient Medications  Medication Sig Dispense Refill  . atorvastatin (LIPITOR) 20 MG tablet Take 20 mg by mouth once daily  . hydrALAZINE  (APRESOLINE) 10 MG tablet Take 1 tablet by mouth 2 (two) times daily 3  . iron,carb/vit C/vit B12/folic (IRON 244 PLUS ORAL) Take by mouth  . isosorbide mononitrate (IMDUR) 30 MG ER tablet Take 30 mg by mouth once daily 3  . omeprazole (PRILOSEC) 40 MG DR capsule Take 40 mg by mouth once daily 5  . tamsulosin (FLOMAX) 0.4 mg capsule Take 1 capsule by mouth once daily  . TANDEM DUAL ACTION 162-115.2 (106) mg Cap per capsule Take 1 capsule by mouth once daily 5   No current facility-administered medications for this visit.   Allergies: Patient has no known allergies.  Social and Family History  Social History reports that he quit smoking about 4 years ago. His smoking use included cigarettes. He has a 60.00 pack-year smoking history. He has never used smokeless tobacco. Drug use questions deferred to the physician. He reports that he does not drink alcohol.  Family History History reviewed. No pertinent family history.  Review of Systems   Review of Systems: The patient denies chest pain, shortness of breath, orthopnea, paroxysmal nocturnal dyspnea, pedal edema, palpitations, heart racing, fatigue, dizziness, lightheadedness, presyncope, syncope, leg pain, leg cramping. Review of 12 Systems is negative except as described in HPI.   Physical Examination   Vitals:BP 134/72  Pulse 68  Ht 172.7 cm (5\' 8" )  Wt 71.2 kg (156 lb 15.5 oz)  SpO2 98%  BMI 23.87 kg/m  Ht:172.7 cm (5\' 8" ) Wt:71.2 kg (156 lb 15.5 oz) WNU:UVOZ surface area is 1.85 meters squared. Body mass index is 23.87 kg/m.  General: Well developed, well nourished. In no acute distress HEENT: Pupils equally reactive to light and accomodation  Neck: Supple without thyromegaly, or goiter. Carotid  pulses 2+. No carotid bruits present.  Pulmonary: Clear to auscultation bilaterally; no wheezes, rales, rhonchi Cardiovascular: Regular rate and rhythm. No gallops, murmurs or rubs Gastrointestinal: Soft nontender, nondistended, with  normal bowel sounds Extremities: No cyanosis, clubbing, or edema Peripheral Pulses: 2+ in upper extremities, 2+ in lower extremities  Neurology: Alert and oriented X3 Pysch: Good affect. Responds appropriately  Assessment   84 y.o. male with  1. PAD (peripheral artery disease) (CMS-HCC)  2. Diabetes mellitus type 2 in nonobese (CMS-HCC)  3. Hyperlipidemia, unspecified hyperlipidemia type  4. Chest pain at rest, unspecified  5. Essential hypertension   Plan  1. Peripheral artery disease -Continue atorvastatin 20mg  once daily and routine follow up with Dr. Delana Meyer  2. Type 2 diabetes -Continue routine follow up with PCP -A1C goal <6.0 3. Hyperlipidemia  -Continue atorvastatin 20mg  daily  4. Chest pain -With abnormal functional study and ongoing symptoms, will further assess with cardiac catheterization  5. Essential hypertension  -Continue hydralazine 10mg  twice daily -Low sodium diet encouraged   Orders Placed This Encounter  Procedures  . CBC w/auto Differential (5 Part)  . Basic Metabolic Panel (BMP)   Return after cath.    Javier Docker Lorri Fukuhara MD  Pt seen and examined. No change from above.

## 2017-12-03 NOTE — Discharge Instructions (Signed)

## 2017-12-19 DIAGNOSIS — Z8711 Personal history of peptic ulcer disease: Secondary | ICD-10-CM

## 2018-01-30 ENCOUNTER — Ambulatory Visit: Payer: Commercial Managed Care - PPO | Admitting: Podiatry

## 2018-02-16 ENCOUNTER — Emergency Department: Payer: Medicare HMO

## 2018-02-16 ENCOUNTER — Other Ambulatory Visit: Payer: Self-pay

## 2018-02-16 ENCOUNTER — Encounter: Payer: Self-pay | Admitting: Emergency Medicine

## 2018-02-16 ENCOUNTER — Emergency Department
Admission: EM | Admit: 2018-02-16 | Discharge: 2018-02-16 | Disposition: A | Discharge status: Home / Self Care | Payer: Medicare HMO | Attending: Emergency Medicine | Admitting: Emergency Medicine

## 2018-02-16 DIAGNOSIS — I10 Essential (primary) hypertension: Secondary | ICD-10-CM | POA: Diagnosis not present

## 2018-02-16 DIAGNOSIS — Z87891 Personal history of nicotine dependence: Secondary | ICD-10-CM | POA: Insufficient documentation

## 2018-02-16 DIAGNOSIS — K8500 Idiopathic acute pancreatitis without necrosis or infection: Secondary | ICD-10-CM

## 2018-02-16 DIAGNOSIS — R1013 Epigastric pain: Secondary | ICD-10-CM

## 2018-02-16 DIAGNOSIS — E119 Type 2 diabetes mellitus without complications: Secondary | ICD-10-CM | POA: Diagnosis not present

## 2018-02-16 LAB — COMPREHENSIVE METABOLIC PANEL
ALBUMIN: 4 g/dL (ref 3.5–5.0)
ALT: 18 U/L (ref 0–44)
ANION GAP: 5 (ref 5–15)
AST: 20 U/L (ref 15–41)
Alkaline Phosphatase: 72 U/L (ref 38–126)
BUN: 20 mg/dL (ref 8–23)
CHLORIDE: 107 mmol/L (ref 98–111)
CO2: 27 mmol/L (ref 22–32)
Calcium: 9.4 mg/dL (ref 8.9–10.3)
Creatinine, Ser: 1.17 mg/dL (ref 0.61–1.24)
GFR calc Af Amer: >60 mL/min (ref >60)
GFR calc non Af Amer: 56 mL/min — ABNORMAL LOW (ref >60)
GLUCOSE: 143 mg/dL — AB (ref 70–99)
POTASSIUM: 3.8 mmol/L (ref 3.5–5.1)
SODIUM: 139 mmol/L (ref 135–145)
Total Bilirubin: 0.4 mg/dL (ref 0.3–1.2)
Total Protein: 7.3 g/dL (ref 6.5–8.1)

## 2018-02-16 LAB — CBC
HEMATOCRIT: 41.6 % (ref 39.0–52.0)
HEMOGLOBIN: 13.5 g/dL (ref 13.0–17.0)
MCH: 31.3 pg (ref 26.0–34.0)
MCHC: 32.5 g/dL (ref 30.0–36.0)
MCV: 96.5 fL (ref 80.0–100.0)
Platelets: 164 10*3/uL (ref 150–400)
RBC: 4.31 MIL/uL (ref 4.22–5.81)
RDW: 13.4 % (ref 11.5–15.5)
WBC: 6.9 10*3/uL (ref 4.0–10.5)
nRBC: 0 % (ref 0.0–0.2)

## 2018-02-16 LAB — TROPONIN I: Troponin I: <0.03 ng/mL (ref <0.03)

## 2018-02-16 LAB — LIPASE, BLOOD: Lipase: 59 U/L — ABNORMAL HIGH (ref 11–51)

## 2018-02-16 MED ORDER — PANTOPRAZOLE SODIUM 40 MG PO TBEC: 40.0000 mg | DELAYED_RELEASE_TABLET | Freq: Every day | ORAL | 1 refills | Status: AC

## 2018-02-16 MED ORDER — TRAMADOL HCL 50 MG PO TABS: 25.0000 mg | ORAL_TABLET | Freq: Four times a day (QID) | ORAL | 0 refills | Status: DC | PRN

## 2018-02-16 MED ORDER — IOHEXOL 300 MG/ML  SOLN
100.0000 mL | Freq: Once | INTRAMUSCULAR | Status: AC | PRN
  Administered 2018-02-16: 100 mL via INTRAVENOUS

## 2018-02-16 MED ORDER — SODIUM CHLORIDE 0.9% FLUSH: 3.0000 mL | Freq: Once | INTRAVENOUS | Status: DC

## 2018-02-16 NOTE — ED Provider Notes (Addendum)
[facility] Emergency Department Provider Note       Time seen: ----------------------------------------- 2:28 PM on 02/16/2018 -----------------------------------------   I have reviewed the triage vital signs and the nursing notes.  HISTORY   Chief Complaint Abdominal Pain   HPI Scott Hudson is a 85 y.o. male with a history of anemia, diabetes, GERD, hyperlipidemia, hypertension, peripheral vascular disease, thrombocytopenia who presents to the ED for abdominal distention.  Patient states his abdomen become so swollen that it gets hard to breathe from time to time.  Patient has had these symptoms on and off for several days.  He has seen a gastroenterologist in the past for similar.  He does take antacids, nothing makes his symptoms better.  Past Medical History:  Diagnosis Date  . Anemia   . Diabetes mellitus without complication ([place])   . GERD (gastroesophageal reflux disease)   . Hypercholesteremia   . Hypertension   . Peripheral vascular disease ([place])   . Thrombocytopenia [facility])     Patient Active Problem List   Diagnosis Date Noted  . GERD (gastroesophageal reflux disease) 11/04/2016  . Diabetes mellitus type 2 in nonobese ([place]) 05/02/2016  . Hyperlipidemia 05/02/2016  . PAD (peripheral artery disease) ([place]) 05/02/2016  . Absolute anemia   . Benign neoplasm of descending colon   . Iron deficiency anemia due to chronic blood loss 06/17/2015    Past Surgical History:  Procedure Laterality Date  . COLONOSCOPY    . COLONOSCOPY WITH PROPOFOL N/A 11/08/2015   Procedure: COLONOSCOPY WITH PROPOFOL;  Surgeon: Lucilla Lame, MD;  Location: [facility] ENDOSCOPY;  Service: Endoscopy;  Laterality: N/A;  . LEFT HEART CATH AND CORONARY ANGIOGRAPHY Left 12/03/2017   Procedure: LEFT HEART CATH AND CORONARY ANGIOGRAPHY;  Surgeon: Teodoro Spray, MD;  Location: [place] [facility];  Service: Cardiovascular;  Laterality: Left;  . PERIPHERAL VASCULAR  CATHETERIZATION  11/30/2014   Procedure: Lower Extremity Intervention;  Surgeon: Katha Cabal, MD;  Location: [place] [facility];  Service: Cardiovascular;;  . PERIPHERAL VASCULAR CATHETERIZATION N/A 11/30/2014   Procedure: Abdominal Aortogram w/Lower Extremity;  Surgeon: Katha Cabal, MD;  Location: [place] [facility];  Service: Cardiovascular;  Laterality: N/A;  . rt hand trama, tips of two fingers removed      Allergies Patient has no known allergies.  Social History Social History   Tobacco Use  . Smoking status: Former Smoker    Packs/day: 1.50    Years: 40.00    Pack years: 60.00    Types: Cigarettes    Last attempt to quit: 11/29/2013    Years since quitting: 4.2  . Smokeless tobacco: Never Used  Substance Use Topics  . Alcohol use: No  . Drug use: No   Review of Systems Constitutional: Negative for fever. Cardiovascular: Negative for chest pain. Respiratory: Negative for shortness of breath. Gastrointestinal: Positive for abdominal pain Musculoskeletal: Negative for back pain. Skin: Negative for rash. Neurological: Negative for headaches, focal weakness or numbness.  All systems negative/normal/unremarkable except as stated in the HPI  ____________________________________________   PHYSICAL EXAM:  VITAL SIGNS: ED Triage Vitals  Enc Vitals Group     BP 02/16/18 1333 (!) 156/69     Pulse Rate 02/16/18 1333 88     Resp 02/16/18 1333 18     Temp 02/16/18 1333 97.6 F (36.4 C)     Temp Source 02/16/18 1333 Oral     SpO2 02/16/18 1333 95 %     Weight 02/16/18 1335  158 lb (71.7 kg)     Height 02/16/18 1335 5\' 8"  (1.727 m)     Head Circumference --      Peak Flow --      Pain Score 02/16/18 1335 6     Pain Loc --      Pain Edu? --      Excl. in [place]? --    Constitutional: Alert and oriented. Well appearing and in no distress. Eyes: Conjunctivae are normal. Normal extraocular movements. ENT      Head: Normocephalic and atraumatic.      Nose:  No congestion/rhinnorhea.      Mouth/Throat: Mucous membranes are moist.      Neck: No stridor. Cardiovascular: Normal rate, regular rhythm. No murmurs, rubs, or gallops. Respiratory: Normal respiratory effort without tachypnea nor retractions. Breath sounds are clear and equal bilaterally. No wheezes/rales/rhonchi. Gastrointestinal: Mild epigastric tenderness, no rebound or guarding.  Normal bowel sounds. Musculoskeletal: Nontender with normal range of motion in extremities. No lower extremity tenderness nor edema. Neurologic:  Normal speech and language. No gross focal neurologic deficits are appreciated.  Skin:  Skin is warm, dry and intact. No rash noted. Psychiatric: Mood and affect are normal. Speech and behavior are normal.  ____________________________________________  EKG: Interpreted by me.  Sinus rhythm the rate of 80 bpm, left axis deviation, normal QT  ____________________________________________  ED COURSE:  As part of my medical decision making, I reviewed the following data within the [place] History obtained from family if available, nursing notes, old chart and ekg, as well as notes from prior ED visits. Patient presented for epigastric pain, we will assess with labs and imaging as indicated at this time.   Procedures ____________________________________________   LABS (pertinent positives/negatives)  Labs Reviewed  LIPASE, BLOOD - Abnormal; Notable for the following components:      Result Value   Lipase 59 (*)    All other components within normal limits  COMPREHENSIVE METABOLIC PANEL - Abnormal; Notable for the following components:   Glucose, Bld 143 (*)    GFR calc non Af Amer 56 (*)    All other components within normal limits  CBC  TROPONIN I  URINALYSIS, COMPLETE (UACMP) WITH MICROSCOPIC    RADIOLOGY  CT of the abd/pelvis Is pending at this time ____________________________________________   DIFFERENTIAL DIAGNOSIS   GERD,  peptic ulcer disease, gastritis, pancreatitis, AAA  FINAL ASSESSMENT AND PLAN  Abdominal pain, mild pancreatitis   Plan: The patient had presented for upper abdominal pain. Patient's labs were reassuring although lipase was borderline elevated. Patient's imaging is still pending.   Laurence Aly, MD    Note: This note was generated in part or whole with voice recognition software. Voice recognition is usually quite accurate but there are transcription errors that can and very often do occur. I apologize for any typographical errors that were not detected and corrected.     Earleen Newport, MD 02/16/18 [place]    Earleen Newport, MD 02/16/18 [phone]

## 2018-02-16 NOTE — ED Notes (Signed)
First nurse note  Presents with some abd "squeezing"   And feels SOB

## 2018-02-16 NOTE — Discharge Instructions (Addendum)
As we discussed please take your medication as prescribed.  Please call the number to follow-up with GI medicine if your pain worsens or fails to improve.  Please adhere to a clear liquid diet for the next 48 hours, you may then slowly increase your diet but avoiding fatty/oily foods for at least 7 days.  Please see the attached eating plan for further details.  Return to the emergency department for any worsening pain, fever, or any other symptom personally concerning to yourself.

## 2018-02-16 NOTE — ED Triage Notes (Signed)
Pt presents to ED via POV with c/o abdominal distention. Pt states "it gets so swollen that it gets hard to breathe". Pt is A&O x4, ambulatory with no difficulty.

## 2018-02-16 NOTE — ED Provider Notes (Signed)
-----------------------------------------   4:08 PM on 02/16/2018 -----------------------------------------  Patient care assumed from Dr. Jimmye Norman.  Patient CT scan is reassuring.  Only significant abnormality is a mild elevation in lipase on the blood work.  Given the upper abdominal discomfort we will treat with Protonix, we will discharge with a short course of pain medication.  I also discussed pancreatitis diet including 48 hours of clear liquid diet, slowly adding in foods after that but a full 7 days before any fatty/oily foods.  Patient and wife agreeable to plan of care.  We will refer to GI medicine if the pain gets any worse or fails to resolve with treatment patient is to follow-up with GI medicine.  Patient and wife agreeable to this plan of care.   Harvest Dark, MD 02/16/18 [phone]

## 2018-04-08 ENCOUNTER — Ambulatory Visit: Payer: Self-pay | Admitting: Gastroenterology

## 2018-04-28 ENCOUNTER — Observation Stay
Admit: 2018-04-28 | Discharge: 2018-04-28 | Disposition: A | Discharge status: Home / Self Care | Payer: Medicare HMO | Attending: Internal Medicine | Admitting: Internal Medicine

## 2018-04-28 ENCOUNTER — Observation Stay
Admission: EM | Admit: 2018-04-28 | Discharge: 2018-04-29 | Disposition: A | Discharge status: Home / Self Care | Payer: Medicare HMO | Attending: Internal Medicine | Admitting: Internal Medicine

## 2018-04-28 ENCOUNTER — Encounter: Payer: Self-pay | Admitting: Emergency Medicine

## 2018-04-28 ENCOUNTER — Emergency Department: Payer: Medicare HMO

## 2018-04-28 ENCOUNTER — Other Ambulatory Visit: Payer: Self-pay

## 2018-04-28 DIAGNOSIS — E785 Hyperlipidemia, unspecified: Secondary | ICD-10-CM | POA: Diagnosis not present

## 2018-04-28 DIAGNOSIS — R0789 Other chest pain: Secondary | ICD-10-CM | POA: Diagnosis not present

## 2018-04-28 DIAGNOSIS — Z87891 Personal history of nicotine dependence: Secondary | ICD-10-CM | POA: Insufficient documentation

## 2018-04-28 DIAGNOSIS — Z79899 Other long term (current) drug therapy: Secondary | ICD-10-CM | POA: Insufficient documentation

## 2018-04-28 DIAGNOSIS — I10 Essential (primary) hypertension: Secondary | ICD-10-CM | POA: Diagnosis not present

## 2018-04-28 DIAGNOSIS — R079 Chest pain, unspecified: Secondary | ICD-10-CM | POA: Diagnosis present

## 2018-04-28 DIAGNOSIS — E1151 Type 2 diabetes mellitus with diabetic peripheral angiopathy without gangrene: Secondary | ICD-10-CM | POA: Insufficient documentation

## 2018-04-28 DIAGNOSIS — K219 Gastro-esophageal reflux disease without esophagitis: Secondary | ICD-10-CM | POA: Insufficient documentation

## 2018-04-28 DIAGNOSIS — D509 Iron deficiency anemia, unspecified: Secondary | ICD-10-CM | POA: Diagnosis not present

## 2018-04-28 LAB — BASIC METABOLIC PANEL
Anion gap: 8 (ref 5–15)
BUN: 17 mg/dL (ref 8–23)
CO2: 25 mmol/L (ref 22–32)
Calcium: 9.3 mg/dL (ref 8.9–10.3)
Chloride: 106 mmol/L (ref 98–111)
Creatinine, Ser: 1.43 mg/dL — ABNORMAL HIGH (ref 0.61–1.24)
GFR calc Af Amer: 51 mL/min — ABNORMAL LOW (ref >60)
GFR calc non Af Amer: 44 mL/min — ABNORMAL LOW (ref >60)
Glucose, Bld: 170 mg/dL — ABNORMAL HIGH (ref 70–99)
Potassium: 3.7 mmol/L (ref 3.5–5.1)
Sodium: 139 mmol/L (ref 135–145)

## 2018-04-28 LAB — TROPONIN I
Troponin I: <0.03 ng/mL (ref <0.03)
Troponin I: 0.05 ng/mL
Troponin I: 0.03 ng/mL (ref <0.03)

## 2018-04-28 LAB — CBC
HCT: 42.5 % (ref 39.0–52.0)
Hemoglobin: 13.9 g/dL (ref 13.0–17.0)
MCH: 30.5 pg (ref 26.0–34.0)
MCHC: 32.7 g/dL (ref 30.0–36.0)
MCV: 93.2 fL (ref 80.0–100.0)
Platelets: 145 10*3/uL — ABNORMAL LOW (ref 150–400)
RBC: 4.56 MIL/uL (ref 4.22–5.81)
RDW: 13.8 % (ref 11.5–15.5)
WBC: 5.5 10*3/uL (ref 4.0–10.5)
nRBC: 0 % (ref 0.0–0.2)

## 2018-04-28 MED ORDER — SODIUM CHLORIDE 0.9 % IV SOLN: 250.0000 mL | INTRAVENOUS | Status: DC | PRN

## 2018-04-28 MED ORDER — ACETAMINOPHEN 325 MG PO TABS: 650.0000 mg | ORAL_TABLET | ORAL | Status: DC | PRN

## 2018-04-28 MED ORDER — SODIUM CHLORIDE 0.9% FLUSH: 3.0000 mL | INTRAVENOUS | Status: DC | PRN

## 2018-04-28 MED ORDER — ISOSORBIDE MONONITRATE ER 30 MG PO TB24
30.0000 mg | ORAL_TABLET | Freq: Every day | ORAL | Status: DC
  Administered 2018-04-28 – 2018-04-29 (×2): 30 mg via ORAL
  Filled 2018-04-28 (×2): qty 1

## 2018-04-28 MED ORDER — ASPIRIN EC 81 MG PO TBEC
81.0000 mg | DELAYED_RELEASE_TABLET | Freq: Every day | ORAL | Status: DC
  Administered 2018-04-29: 81 mg via ORAL
  Filled 2018-04-28: qty 1

## 2018-04-28 MED ORDER — ONDANSETRON HCL 4 MG/2ML IJ SOLN: 4.0000 mg | Freq: Four times a day (QID) | INTRAMUSCULAR | Status: DC | PRN

## 2018-04-28 MED ORDER — ASPIRIN 81 MG PO CHEW
324.0000 mg | CHEWABLE_TABLET | ORAL | Status: AC
  Administered 2018-04-28: 324 mg via ORAL
  Filled 2018-04-28: qty 4

## 2018-04-28 MED ORDER — ENOXAPARIN SODIUM 40 MG/0.4ML ~~LOC~~ SOLN
40.0000 mg | SUBCUTANEOUS | Status: DC
  Administered 2018-04-28: 40 mg via SUBCUTANEOUS
  Filled 2018-04-28: qty 0.4

## 2018-04-28 MED ORDER — NITROGLYCERIN 0.4 MG SL SUBL: 0.4000 mg | SUBLINGUAL_TABLET | SUBLINGUAL | Status: DC | PRN

## 2018-04-28 MED ORDER — ATORVASTATIN CALCIUM 20 MG PO TABS
20.0000 mg | ORAL_TABLET | Freq: Every evening | ORAL | Status: DC
  Administered 2018-04-28: 20 mg via ORAL
  Filled 2018-04-28: qty 1

## 2018-04-28 MED ORDER — ASPIRIN 300 MG RE SUPP: 300.0000 mg | RECTAL | Status: AC

## 2018-04-28 MED ORDER — TAMSULOSIN HCL 0.4 MG PO CAPS
0.4000 mg | ORAL_CAPSULE | Freq: Every evening | ORAL | Status: DC
  Administered 2018-04-28: 0.4 mg via ORAL
  Filled 2018-04-28: qty 1

## 2018-04-28 MED ORDER — IOHEXOL 350 MG/ML SOLN
75.0000 mL | Freq: Once | INTRAVENOUS | Status: AC | PRN
  Administered 2018-04-28: 75 mL via INTRAVENOUS

## 2018-04-28 MED ORDER — SODIUM CHLORIDE 0.9% FLUSH
3.0000 mL | Freq: Two times a day (BID) | INTRAVENOUS | Status: DC
  Administered 2018-04-28 – 2018-04-29 (×2): 3 mL via INTRAVENOUS

## 2018-04-28 MED ORDER — PANTOPRAZOLE SODIUM 40 MG PO TBEC
40.0000 mg | DELAYED_RELEASE_TABLET | Freq: Every day | ORAL | Status: DC
  Administered 2018-04-28 – 2018-04-29 (×2): 40 mg via ORAL
  Filled 2018-04-28 (×2): qty 1

## 2018-04-28 MED ORDER — FERROUS FUMARATE 324 (106 FE) MG PO TABS
1.0000 | ORAL_TABLET | Freq: Every day | ORAL | Status: DC
  Filled 2018-04-28 (×2): qty 1

## 2018-04-28 MED ORDER — HYDRALAZINE HCL 10 MG PO TABS
10.0000 mg | ORAL_TABLET | Freq: Every day | ORAL | Status: DC
  Administered 2018-04-28 – 2018-04-29 (×2): 10 mg via ORAL
  Filled 2018-04-28 (×2): qty 1

## 2018-04-28 NOTE — Progress Notes (Signed)
CRITICAL VALUE ALERT  Critical Value:  Troponin 0.05  Date & Time Notied:  04/28/18 1746  Provider Notified: pyreddy  Orders Received/Actions taken: no new orders

## 2018-04-28 NOTE — Plan of Care (Signed)

## 2018-04-28 NOTE — H&P (Signed)
Scott Hudson NAME: Nina Hoar    MR#:  [id number]  DATE OF BIRTH:  [birth year]  DATE OF ADMISSION:  04/28/2018  PRIMARY CARE PHYSICIAN: Albina Billet, MD   REQUESTING/REFERRING PHYSICIAN:   CHIEF COMPLAINT:   Chief Complaint  Patient presents with  . Chest Pain    HISTORY OF PRESENT ILLNESS: Scott Hudson  is a 85 y.o. male with a known history of diabetes mellitus type 2, GERD, hyperlipidemia, hypertension, peripheral vascular disease presented to the emergency room for chest pain.  Chest pain started this morning retrosternally located sharp in nature 5 out of 10 on a scale of 1-10.  Patient was evaluated in the emergency room first set of troponin is negative.  Hospitalist service was consulted for further care.  PAST MEDICAL HISTORY:   Past Medical History:  Diagnosis Date  . Anemia   . Diabetes mellitus without complication ([place])   . GERD (gastroesophageal reflux disease)   . Hypercholesteremia   . Hypertension   . Peripheral vascular disease ([place])   . Thrombocytopenia ([place])     PAST SURGICAL HISTORY:  Past Surgical History:  Procedure Laterality Date  . COLONOSCOPY    . COLONOSCOPY WITH PROPOFOL N/A 11/08/2015   Procedure: COLONOSCOPY WITH PROPOFOL;  Surgeon: Lucilla Lame, MD;  Location: [facility] ENDOSCOPY;  Service: Endoscopy;  Laterality: N/A;  . LEFT HEART CATH AND CORONARY ANGIOGRAPHY Left 12/03/2017   Procedure: LEFT HEART CATH AND CORONARY ANGIOGRAPHY;  Surgeon: Teodoro Spray, MD;  Location: [place] [facility];  Service: Cardiovascular;  Laterality: Left;  . PERIPHERAL VASCULAR CATHETERIZATION  11/30/2014   Procedure: Lower Extremity Intervention;  Surgeon: Katha Cabal, MD;  Location: [place] [facility];  Service: Cardiovascular;;  . PERIPHERAL VASCULAR CATHETERIZATION N/A 11/30/2014   Procedure: Abdominal Aortogram w/Lower Extremity;  Surgeon: Katha Cabal, MD;  Location: [place]  [facility];  Service: Cardiovascular;  Laterality: N/A;  . rt hand trama, tips of two fingers removed      SOCIAL HISTORY:  Social History   Tobacco Use  . Smoking status: Former Smoker    Packs/day: 1.50    Years: 40.00    Pack years: 60.00    Types: Cigarettes    Last attempt to quit: 11/29/2013    Years since quitting: 4.4  . Smokeless tobacco: Never Used  Substance Use Topics  . Alcohol use: No    FAMILY HISTORY:  Family History  Problem Relation Age of Onset  . Dementia Brother     DRUG ALLERGIES: No Known Allergies  REVIEW OF SYSTEMS:   CONSTITUTIONAL: No fever, fatigue or weakness.  EYES: No blurred or double vision.  EARS, NOSE, AND THROAT: No tinnitus or ear pain.  RESPIRATORY: No cough, shortness of breath, wheezing or hemoptysis.  CARDIOVASCULAR: Had chest pain, no orthopnea, edema.  GASTROINTESTINAL: No nausea, vomiting, diarrhea or abdominal pain.  GENITOURINARY: No dysuria, hematuria.  ENDOCRINE: No polyuria, nocturia,  HEMATOLOGY: No anemia, easy bruising or bleeding SKIN: No rash or lesion. MUSCULOSKELETAL: No joint pain or arthritis.   NEUROLOGIC: No tingling, numbness, weakness.  PSYCHIATRY: No anxiety or depression.   MEDICATIONS AT HOME:  Prior to Admission medications   Medication Sig Start Date End Date Taking? Authorizing Provider  hydrALAZINE (APRESOLINE) 10 MG tablet Take 10 mg by mouth daily.  06/09/15  Yes [provider]  isosorbide mononitrate (IMDUR) 30 MG 24 hr tablet Take 30 mg by mouth daily. 07/28/15  Yes [provider]  atorvastatin (LIPITOR) 20 MG tablet Take 20 mg by mouth every evening. 12/18/16   [provider]  ferrous fumarate-iron polysaccharide complex (TANDEM) 162-115.2 MG CAPS capsule Take 1 capsule by mouth daily with breakfast. 10/28/15   Cammie Sickle, MD  omeprazole (PRILOSEC) 40 MG capsule Take 40 mg by mouth daily. Reported on 06/16/2015    [provider]  pantoprazole  (PROTONIX) 40 MG tablet Take 1 tablet (40 mg total) by mouth daily. 02/16/18 02/16/19  Harvest Dark, MD  tamsulosin (FLOMAX) 0.4 MG CAPS capsule Take 0.4 mg by mouth every evening. 05/29/15   [provider]  traMADol (ULTRAM) 50 MG tablet Take 0.5 tablets (25 mg total) by mouth every 6 (six) hours as needed. 02/16/18   Harvest Dark, MD      PHYSICAL EXAMINATION:   VITAL SIGNS: Blood pressure (!) 167/93, pulse 88, temperature 97.8 F (36.6 C), temperature source Oral, resp. rate (!) 24, height 5' 8.5" (1.74 m), weight 72.1 kg, SpO2 100 %.  GENERAL:  85 y.o.-year-old patient lying in the bed with no acute distress.  EYES: Pupils equal, round, reactive to light and accommodation. No scleral icterus. Extraocular muscles intact.  HEENT: Head atraumatic, normocephalic. Oropharynx and nasopharynx clear.  NECK:  Supple, no jugular venous distention. No thyroid enlargement, no tenderness.  LUNGS: Normal breath sounds bilaterally, no wheezing, rales,rhonchi or crepitation. No use of accessory muscles of respiration.  CARDIOVASCULAR: S1, S2 normal. No murmurs, rubs, or gallops.  ABDOMEN: Soft, nontender, nondistended. Bowel sounds present. No organomegaly or mass.  EXTREMITIES: No pedal edema, cyanosis, or clubbing.  NEUROLOGIC: Cranial nerves II through XII are intact. Muscle strength 5/5 in all extremities. Sensation intact. Gait not checked.  PSYCHIATRIC: The patient is alert and oriented x 3.  SKIN: No obvious rash, lesion, or ulcer.   LABORATORY PANEL:   CBC Recent Labs  Lab 04/28/18 1220  WBC 5.5  HGB 13.9  HCT 42.5  PLT 145*  MCV 93.2  MCH 30.5  MCHC 32.7  RDW 13.8   ------------------------------------------------------------------------------------------------------------------  Chemistries  Recent Labs  Lab 04/28/18 1220  NA 139  K 3.7  CL 106  CO2 25  GLUCOSE 170*  BUN 17  CREATININE 1.43*  CALCIUM 9.3    ------------------------------------------------------------------------------------------------------------------ estimated creatinine clearance is 36.5 mL/min (A) (by C-G formula based on SCr of 1.43 mg/dL (H)). ------------------------------------------------------------------------------------------------------------------ No results for input(s): TSH, T4TOTAL, T3FREE, THYROIDAB in the last 72 hours.  Invalid input(s): FREET3   Coagulation profile No results for input(s): INR, PROTIME in the last 168 hours. ------------------------------------------------------------------------------------------------------------------- No results for input(s): DDIMER in the last 72 hours. -------------------------------------------------------------------------------------------------------------------  Cardiac Enzymes Recent Labs  Lab 04/28/18 1220  TROPONINI <0.03   ------------------------------------------------------------------------------------------------------------------ Invalid input(s): POCBNP  ---------------------------------------------------------------------------------------------------------------  Urinalysis    Component Value Date/Time   COLORURINE YELLOW (A) 06/16/2015 1120   APPEARANCEUR HAZY (A) 06/16/2015 1120   LABSPEC 1.013 06/16/2015 1120   PHURINE 5.0 06/16/2015 1120   GLUCOSEU NEGATIVE 06/16/2015 1120   HGBUR NEGATIVE 06/16/2015 1120   BILIRUBINUR NEGATIVE 06/16/2015 1120   KETONESUR NEGATIVE 06/16/2015 1120   PROTEINUR NEGATIVE 06/16/2015 1120   NITRITE NEGATIVE 06/16/2015 1120   LEUKOCYTESUR 3+ (A) 06/16/2015 1120     RADIOLOGY: Dg Chest 2 View  Result Date: 04/28/2018 CLINICAL DATA:  Chest pain. EXAM: CHEST - 2 VIEW COMPARISON:  Radiographs of June 13, 2015. FINDINGS: The heart size and mediastinal contours are within normal limits. Atherosclerosis of thoracic aorta is noted. Right lung  is clear. No pneumothorax or significant pleural effusion is  noted. Stable elevated left hemidiaphragm is noted with mild left basilar subsegmental atelectasis. The visualized skeletal structures are unremarkable. IMPRESSION: Stable elevated left hemidiaphragm is noted with mild left basilar subsegmental atelectasis. Aortic Atherosclerosis (ICD10-I70.0). Electronically Signed   By: Marijo Conception M.D.   On: 04/28/2018 12:54   Ct Angio Chest Pe W And/or Wo Contrast  Result Date: 04/28/2018 CLINICAL DATA:  Chest pain and shortness of breath EXAM: CT ANGIOGRAPHY CHEST WITH CONTRAST TECHNIQUE: Multidetector CT imaging of the chest was performed using the standard protocol during bolus administration of intravenous contrast. Multiplanar CT image reconstructions and MIPs were obtained to evaluate the vascular anatomy. CONTRAST:  51mL OMNIPAQUE IOHEXOL 350 MG/ML SOLN COMPARISON:  Chest radiograph April 28, 2018 FINDINGS: Cardiovascular: There is no demonstrable pulmonary embolus. There is no thoracic aortic aneurysm or dissection. There are foci of calcification in the proximal visualized great vessels. There is extensive atherosclerotic calcification and irregular plaque throughout the thoracic aorta, particularly in the descending aorta. There are foci of coronary artery calcification. There is no pericardial effusion or pericardial thickening. The main pulmonary outflow tract measures 3.3 cm in diameter, prominent. Mediastinum/Nodes: Thyroid appears unremarkable. There is no thoracic adenopathy. No esophageal lesions are evident. Lungs/Pleura: There is underlying centrilobular emphysematous change. There is atelectatic change in the left base. There is no appreciable edema or consolidation. Upper Abdomen: There is atherosclerotic plaque in the visualized upper abdominal aorta. There are calcified granulomas in the spleen. Visualized upper abdominal structures otherwise appear unremarkable. Musculoskeletal: There are foci of degenerative change in the thoracic spine. There  are no blastic or lytic bone lesions. There are no chest wall lesions. Review of the MIP images confirms the above findings. IMPRESSION: 1. No demonstrable pulmonary embolus. No thoracic aortic aneurysm or dissection. There is extensive aortic atherosclerosis as well as foci of great vessel and coronary artery calcification. 2. Prominence of the main pulmonary outflow tract, a finding felt to be indicative of pulmonary arterial hypertension. 3. Underlying emphysematous change. Atelectatic change noted in the left base. No consolidation. 4.  No demonstrable thoracic adenopathy. 5.  Apparent calcified granulomas in the spleen. Aortic Atherosclerosis (ICD10-I70.0) and Emphysema (ICD10-J43.9). Electronically Signed   By: Lowella Grip III M.D.   On: 04/28/2018 13:37    EKG: Orders placed or performed during the hospital encounter of 04/28/18  . EKG 12-Lead  . EKG 12-Lead  . ED EKG  . ED EKG    IMPRESSION AND PLAN: 85 year old male patient with a known history of diabetes mellitus type 2, GERD, hyperlipidemia, hypertension, peripheral vascular disease presented to the emergency room for chest pain.    -Chest pain Admit patient to telemetry observation bed Cycle troponin rule out ischemia Cardiology consult Cardiac stress test in a.m. to assess for ischemia .Patient on aspirin  -Type 2 diabetes mellitus Diabetic diet Adding scale coverage with insulin  -Hyperlipidemia Continue statin medication  -DVT prophylaxis subcu Lovenox daily   All the records are reviewed and case discussed with ED provider. Management plans discussed with the patient, family and they are in agreement.  CODE STATUS:Full code Code Status History    Date Active Date Inactive Code Status Order ID Comments User Context   12/03/2017 1100 12/03/2017 1623 Full Code [id number]  Teodoro Spray, MD Inpatient   11/30/2014 1236 11/30/2014 1555 Full Code [id number]  Schnier, Dolores Lory, MD Inpatient       TOTAL TIME  TAKING CARE OF  THIS PATIENT: 53 minutes.    Saundra Shelling M.D on 04/28/2018 at 3:57 PM  Between 7am to 6pm - Pager - [phone]  After 6pm go to [email or web address] - password [email or web address] [facility]  [facility]  Office  [phone]  CC: Primary care physician; Albina Billet, MD

## 2018-04-28 NOTE — ED Provider Notes (Signed)
[facility] Emergency Department Provider Note   ____________________________________________    I have reviewed the triage vital signs and the nursing notes.   HISTORY  Chief Complaint Chest Pain     HPI Scott Hudson is a 85 y.o. male with a history of diabetes, hypercholesterolemia, hypertension, PVD who presents with complaints of chest pain.  Patient reports this morning he developed pain in his upper back which then traveled to his chest.  Now currently complains of a pressure-like pain in his left chest which seems to be improving.  No recent travel.  No calf pain or swelling.  No nausea or vomiting.  No diaphoresis.  Is not take anything for this.  Past Medical History:  Diagnosis Date  . Anemia   . Diabetes mellitus without complication ([place])   . GERD (gastroesophageal reflux disease)   . Hypercholesteremia   . Hypertension   . Peripheral vascular disease ([place])   . Thrombocytopenia [facility])     Patient Active Problem List   Diagnosis Date Noted  . Chest pain 04/28/2018  . GERD (gastroesophageal reflux disease) 11/04/2016  . Diabetes mellitus type 2 in nonobese ([place]) 05/02/2016  . Hyperlipidemia 05/02/2016  . PAD (peripheral artery disease) ([place]) 05/02/2016  . Absolute anemia   . Benign neoplasm of descending colon   . Iron deficiency anemia due to chronic blood loss 06/17/2015    Past Surgical History:  Procedure Laterality Date  . COLONOSCOPY    . COLONOSCOPY WITH PROPOFOL N/A 11/08/2015   Procedure: COLONOSCOPY WITH PROPOFOL;  Surgeon: Lucilla Lame, MD;  Location: [facility] ENDOSCOPY;  Service: Endoscopy;  Laterality: N/A;  . LEFT HEART CATH AND CORONARY ANGIOGRAPHY Left 12/03/2017   Procedure: LEFT HEART CATH AND CORONARY ANGIOGRAPHY;  Surgeon: Teodoro Spray, MD;  Location: [place] [facility];  Service: Cardiovascular;  Laterality: Left;  . PERIPHERAL VASCULAR CATHETERIZATION  11/30/2014   Procedure: Lower Extremity  Intervention;  Surgeon: Katha Cabal, MD;  Location: [place] [facility];  Service: Cardiovascular;;  . PERIPHERAL VASCULAR CATHETERIZATION N/A 11/30/2014   Procedure: Abdominal Aortogram w/Lower Extremity;  Surgeon: Katha Cabal, MD;  Location: [place] [facility];  Service: Cardiovascular;  Laterality: N/A;  . rt hand trama, tips of two fingers removed      Prior to Admission medications   Medication Sig Start Date End Date Taking? Authorizing Provider  hydrALAZINE (APRESOLINE) 10 MG tablet Take 10 mg by mouth daily.  06/09/15  Yes [provider]  isosorbide mononitrate (IMDUR) 30 MG 24 hr tablet Take 30 mg by mouth daily. 07/28/15  Yes [provider]  atorvastatin (LIPITOR) 20 MG tablet Take 20 mg by mouth every evening. 12/18/16   [provider]  ferrous fumarate-iron polysaccharide complex (TANDEM) 162-115.2 MG CAPS capsule Take 1 capsule by mouth daily with breakfast. 10/28/15   Cammie Sickle, MD  omeprazole (PRILOSEC) 40 MG capsule Take 40 mg by mouth daily. Reported on 06/16/2015    [provider]  pantoprazole (PROTONIX) 40 MG tablet Take 1 tablet (40 mg total) by mouth daily. 02/16/18 02/16/19  Harvest Dark, MD  tamsulosin (FLOMAX) 0.4 MG CAPS capsule Take 0.4 mg by mouth every evening. 05/29/15   [provider]  traMADol (ULTRAM) 50 MG tablet Take 0.5 tablets (25 mg total) by mouth every 6 (six) hours as needed. 02/16/18   Harvest Dark, MD     Allergies Patient has no known allergies.  Family History  Problem Relation Age of  Onset  . Dementia Brother     Social History Social History   Tobacco Use  . Smoking status: Former Smoker    Packs/day: 1.50    Years: 40.00    Pack years: 60.00    Types: Cigarettes    Last attempt to quit: 11/29/2013    Years since quitting: 4.4  . Smokeless tobacco: Never Used  Substance Use Topics  . Alcohol use: No  . Drug use: No    Review of Systems   Constitutional: No fever/chills Eyes: No visual changes.  ENT: No sore throat. Cardiovascular: As above Respiratory: Denies shortness of breath. Gastrointestinal: No abdominal pain.  Genitourinary: Negative for dysuria. Musculoskeletal: Negative for back pain. Skin: Negative for rash. Neurological: Negative for headaches    ____________________________________________   PHYSICAL EXAM:  VITAL SIGNS: ED Triage Vitals  Enc Vitals Group     BP 04/28/18 1209 (!) 162/86     Pulse Rate 04/28/18 1209 (!) 107     Resp 04/28/18 1209 16     Temp 04/28/18 1209 97.8 F (36.6 C)     Temp Source 04/28/18 1209 Oral     SpO2 04/28/18 1209 96 %     Weight 04/28/18 1211 72.1 kg (159 lb)     Height 04/28/18 1211 1.74 m (5' 8.5")     Head Circumference --      Peak Flow --      Pain Score 04/28/18 1209 5     Pain Loc --      Pain Edu? --      Excl. in [place]? --     Constitutional: Alert and oriented.  Eyes: Conjunctivae are normal.   Nose: No congestion/rhinnorhea. Mouth/Throat: Mucous membranes are moist.    Cardiovascular: Normal rate, regular rhythm. Grossly normal heart sounds.  Good peripheral circulation. Respiratory: Normal respiratory effort.  No retractions. Lungs CTAB. Gastrointestinal: Soft and nontender. No distention.   Musculoskeletal:  Warm and well perfused Neurologic:  Normal speech and language. No gross focal neurologic deficits are appreciated.  Skin:  Skin is warm, dry and intact. No rash noted. Psychiatric: Mood and affect are normal. Speech and behavior are normal.  ____________________________________________   LABS (all labs ordered are listed, but only abnormal results are displayed)  Labs Reviewed  BASIC METABOLIC PANEL - Abnormal; Notable for the following components:      Result Value   Glucose, Bld 170 (*)    Creatinine, Ser 1.43 (*)    GFR calc non Af Amer 44 (*)    GFR calc Af Amer 51 (*)    All other components within normal limits  CBC -  Abnormal; Notable for the following components:   Platelets 145 (*)    All other components within normal limits  TROPONIN I   ____________________________________________  EKG  ED ECG REPORT I, Lavonia Drafts, the attending physician, personally viewed and interpreted this ECG.  Date: 04/28/2018  Rhythm: Tachycardia QRS Axis: normal Intervals: normal ST/T Wave abnormalities: Nonspecific changes Narrative Interpretation: no evidence of acute ischemia  ____________________________________________  RADIOLOGY  Chest x-ray unremarkable ____________________________________________   PROCEDURES  Procedure(s) performed: No  Procedures   Critical Care performed: No ____________________________________________   INITIAL IMPRESSION / ASSESSMENT AND PLAN / ED COURSE  Pertinent labs & imaging results that were available during my care of the patient were reviewed by me and considered in my medical decision making (see chart for details).  Patient presents with complaints of chest pain as described above.  Differential  includes ACS, PE, pericarditis, chest wall pain.  EKG demonstrates sinus tachycardia, pending labs, placed on cardiac monitor   Chest x-ray unremarkable.  Lab work is overall reassuring, sent for CT scan given concern for possible PE, no evidence of PE on CT, no evidence of pneumonia.  Patient continues to have some mild discomfort greatly improved from prior.  Will discuss with hospitalist for admission   Scott Hudson was evaluated in Emergency Department on 04/28/2018 for the symptoms described in the history of present illness. He was evaluated in the context of the global COVID-19 pandemic, which necessitated consideration that the patient might be at risk for infection with the SARS-CoV-2 virus that causes COVID-19. Institutional protocols and algorithms that pertain to the evaluation of patients at risk for COVID-19 are in a state of rapid change based on  information released by regulatory bodies including the CDC and federal and state organizations. These policies and algorithms were followed during the patient's care in the ED.     ____________________________________________   FINAL CLINICAL IMPRESSION(S) / ED DIAGNOSES  Final diagnoses:  Chest pain, unspecified type        Note:  This document was prepared using Dragon voice recognition software and may include unintentional dictation errors.   Lavonia Drafts, MD 04/28/18 1536

## 2018-04-28 NOTE — ED Notes (Signed)
Pts wife, Anastacio Bua: [phone]

## 2018-04-28 NOTE — ED Notes (Signed)
Pt standing at bedside using urinal

## 2018-04-28 NOTE — ED Notes (Signed)
Pt up to use urinal at bedside

## 2018-04-28 NOTE — Progress Notes (Signed)
Advanced care plan. Purpose of the Encounter: CODE STATUS Parties in Attendance: Patient Patient's Decision Capacity: Good Subjective/Patient's story: Scott Hudson  is a 85 y.o. male with a known history of diabetes mellitus type 2, GERD, hyperlipidemia, hypertension, peripheral vascular disease presented to the emergency room for chest pain Objective/Medical story Patient needs cardiology evaluation and work-up with troponin and echocardiogram.  Needs cardiac stress test to assess for ischemia. Goals of care determination:  Advance care directives goals of care and treatment plan discussed Patient wants everything done which includes CPR, intubation ventilator if the need arises. CODE STATUS: Full code Time spent discussing advanced care planning: 16 minutes

## 2018-04-28 NOTE — Progress Notes (Signed)
*  PRELIMINARY RESULTS* Echocardiogram 2D Echocardiogram has been performed.  Scott Hudson 04/28/2018, 5:10 PM

## 2018-04-28 NOTE — ED Notes (Addendum)
Attempted to call report at this time 

## 2018-04-28 NOTE — ED Notes (Signed)
ED TO INPATIENT HANDOFF REPORT  ED Nurse Name and Phone #: Terri Piedra [phone]  S Name/Age/Gender Scott Hudson 85 y.o. male Room/Bed: ED24A/ED24A  Code Status   Code Status: Prior  Home/SNF/Other Home Patient oriented to: self, place, time and situation Is this baseline? Yes   Triage Complete: Triage complete  Chief Complaint chest pain  Triage Note Patient presents to the ED with chest pain that began at 9am.  Patient is complaining of left sided chest pain radiating into upper back and left shoulder.  Patient describes pain as a pressure.  Patient also reports some shortness of breath. Denies cough and fever.     Allergies No Known Allergies  Level of Care/Admitting Diagnosis ED Disposition    ED Disposition Condition [facility] Area: [place] [[id number]]  Level of Care: Telemetry [5]  Covid Evaluation: N/A  Diagnosis: Chest pain [376283]  Admitting Physician: Saundra Shelling [[id number]]  Attending Physician: Saundra Shelling [[id number]]  PT Class (Do Not Modify): Observation [104]  PT Acc Code (Do Not Modify): Observation [10022]       B Medical/Surgery History Past Medical History:  Diagnosis Date  . Anemia   . Diabetes mellitus without complication ([place])   . GERD (gastroesophageal reflux disease)   . Hypercholesteremia   . Hypertension   . Peripheral vascular disease ([place])   . Thrombocytopenia ([place])    Past Surgical History:  Procedure Laterality Date  . COLONOSCOPY    . COLONOSCOPY WITH PROPOFOL N/A 11/08/2015   Procedure: COLONOSCOPY WITH PROPOFOL;  Surgeon: Lucilla Lame, MD;  Location: [facility] ENDOSCOPY;  Service: Endoscopy;  Laterality: N/A;  . LEFT HEART CATH AND CORONARY ANGIOGRAPHY Left 12/03/2017   Procedure: LEFT HEART CATH AND CORONARY ANGIOGRAPHY;  Surgeon: Teodoro Spray, MD;  Location: [place] [facility];  Service: Cardiovascular;  Laterality: Left;  . PERIPHERAL VASCULAR CATHETERIZATION  11/30/2014    Procedure: Lower Extremity Intervention;  Surgeon: Katha Cabal, MD;  Location: [place] [facility];  Service: Cardiovascular;;  . PERIPHERAL VASCULAR CATHETERIZATION N/A 11/30/2014   Procedure: Abdominal Aortogram w/Lower Extremity;  Surgeon: Katha Cabal, MD;  Location: [place] [facility];  Service: Cardiovascular;  Laterality: N/A;  . rt hand trama, tips of two fingers removed       A IV Location/Drains/Wounds Patient Lines/Drains/Airways Status   Active Line/Drains/Airways    Name:   Placement date:   Placement time:   Site:   Days:   Peripheral IV 04/28/18 Left Antecubital   04/28/18    1223    Antecubital   less than 1          Intake/Output Last 24 hours No intake or output data in the 24 hours ending 04/28/18 1520  Labs/Imaging Results for orders placed or performed during the hospital encounter of 04/28/18 (from the past 48 hour(s))  Basic metabolic panel     Status: Abnormal   Collection Time: 04/28/18 12:20 PM  Result Value Ref Range   Sodium 139 135 - 145 mmol/L   Potassium 3.7 3.5 - 5.1 mmol/L   Chloride 106 98 - 111 mmol/L   CO2 25 22 - 32 mmol/L   Glucose, Bld 170 (H) 70 - 99 mg/dL   BUN 17 8 - 23 mg/dL   Creatinine, Ser 1.43 (H) 0.61 - 1.24 mg/dL   Calcium 9.3 8.9 - 10.3 mg/dL   GFR calc non Af Amer 44 (L) >60 mL/min   GFR calc Af Amer 51 (L) >  60 mL/min   Anion gap 8 5 - 15    Comment: Performed at [facility], [address] [place], [place] [address]  CBC     Status: Abnormal   Collection Time: 04/28/18 12:20 PM  Result Value Ref Range   WBC 5.5 4.0 - 10.5 K/uL   RBC 4.56 4.22 - 5.81 MIL/uL   Hemoglobin 13.9 13.0 - 17.0 g/dL   HCT 42.5 39.0 - 52.0 %   MCV 93.2 80.0 - 100.0 fL   MCH 30.5 26.0 - 34.0 pg   MCHC 32.7 30.0 - 36.0 g/dL   RDW 13.8 11.5 - 15.5 %   Platelets 145 (L) 150 - 400 K/uL   nRBC 0.0 0.0 - 0.2 %    Comment: Performed at [facility], [address] [place], [place] [address]  Troponin I - ONCE -  STAT     Status: None   Collection Time: 04/28/18 12:20 PM  Result Value Ref Range   Troponin I <0.03 <0.03 ng/mL    Comment: Performed at [facility], [address], [place], [place] [address]   Dg Chest 2 View  Result Date: 04/28/2018 CLINICAL DATA:  Chest pain. EXAM: CHEST - 2 VIEW COMPARISON:  Radiographs of June 13, 2015. FINDINGS: The heart size and mediastinal contours are within normal limits. Atherosclerosis of thoracic aorta is noted. Right lung is clear. No pneumothorax or significant pleural effusion is noted. Stable elevated left hemidiaphragm is noted with mild left basilar subsegmental atelectasis. The visualized skeletal structures are unremarkable. IMPRESSION: Stable elevated left hemidiaphragm is noted with mild left basilar subsegmental atelectasis. Aortic Atherosclerosis (ICD10-I70.0). Electronically Signed   By: Marijo Conception M.D.   On: 04/28/2018 12:54   Ct Angio Chest Pe W And/or Wo Contrast  Result Date: 04/28/2018 CLINICAL DATA:  Chest pain and shortness of breath EXAM: CT ANGIOGRAPHY CHEST WITH CONTRAST TECHNIQUE: Multidetector CT imaging of the chest was performed using the standard protocol during bolus administration of intravenous contrast. Multiplanar CT image reconstructions and MIPs were obtained to evaluate the vascular anatomy. CONTRAST:  14mL OMNIPAQUE IOHEXOL 350 MG/ML SOLN COMPARISON:  Chest radiograph April 28, 2018 FINDINGS: Cardiovascular: There is no demonstrable pulmonary embolus. There is no thoracic aortic aneurysm or dissection. There are foci of calcification in the proximal visualized great vessels. There is extensive atherosclerotic calcification and irregular plaque throughout the thoracic aorta, particularly in the descending aorta. There are foci of coronary artery calcification. There is no pericardial effusion or pericardial thickening. The main pulmonary outflow tract measures 3.3 cm in diameter, prominent. Mediastinum/Nodes: Thyroid  appears unremarkable. There is no thoracic adenopathy. No esophageal lesions are evident. Lungs/Pleura: There is underlying centrilobular emphysematous change. There is atelectatic change in the left base. There is no appreciable edema or consolidation. Upper Abdomen: There is atherosclerotic plaque in the visualized upper abdominal aorta. There are calcified granulomas in the spleen. Visualized upper abdominal structures otherwise appear unremarkable. Musculoskeletal: There are foci of degenerative change in the thoracic spine. There are no blastic or lytic bone lesions. There are no chest wall lesions. Review of the MIP images confirms the above findings. IMPRESSION: 1. No demonstrable pulmonary embolus. No thoracic aortic aneurysm or dissection. There is extensive aortic atherosclerosis as well as foci of great vessel and coronary artery calcification. 2. Prominence of the main pulmonary outflow tract, a finding felt to be indicative of pulmonary arterial hypertension. 3. Underlying emphysematous change. Atelectatic change noted in the left base. No consolidation. 4.  No demonstrable thoracic adenopathy. 5.  Apparent calcified granulomas in the spleen. Aortic Atherosclerosis (ICD10-I70.0) and Emphysema (ICD10-J43.9). Electronically Signed   By: Lowella Grip III M.D.   On: 04/28/2018 13:37    Pending Labs [employer] (From admission, onward)    Start     Ordered   Signed and Held  CBC  (enoxaparin (LOVENOX)    CrCl >/= 30 ml/min)  Once,   R    Comments:  Baseline for enoxaparin therapy IF NOT ALREADY DRAWN.  Notify MD if PLT < 100 K.    Signed and Held   Signed and Held  Creatinine, serum  (enoxaparin (LOVENOX)    CrCl >/= 30 ml/min)  Once,   R    Comments:  Baseline for enoxaparin therapy IF NOT ALREADY DRAWN.    Signed and Held   Signed and Held  Creatinine, serum  (enoxaparin (LOVENOX)    CrCl >/= 30 ml/min)  Weekly,   R    Comments:  while on enoxaparin therapy    Signed and Held    Signed and Held  Troponin I - Now Then Q6H  Now then every 6 hours,   STAT     Signed and Held   Signed and Held  Basic metabolic panel  Tomorrow morning,   R     Signed and Held   Signed and Held  Lipid panel  Tomorrow morning,   R     Signed and Held   Signed and Held  CBC  Tomorrow morning,   R     Signed and Held          Vitals/Pain Today's Vitals   04/28/18 1223 04/28/18 1225 04/28/18 1300 04/28/18 1340  BP:   (!) 155/90 (!) 170/90  Pulse:   93   Resp:   (!) 24   Temp:      TempSrc:      SpO2:  96% 94%   Weight:      Height:      PainSc: 5        Isolation Precautions No active isolations  Medications Medications  iohexol (OMNIPAQUE) 350 MG/ML injection 75 mL (75 mLs Intravenous Contrast Given 04/28/18 1325)    Mobility walks Low fall risk   Focused Assessments Cardiac Assessment Handoff:  Cardiac Rhythm: Sinus tachycardia Lab Results  Component Value Date   TROPONINI <0.03 04/28/2018   No results found for: DDIMER Does the Patient currently have chest pain? No     R Recommendations: See Admitting Provider Note  Report given to:   Additional Notes:

## 2018-04-28 NOTE — ED Triage Notes (Signed)
Patient presents to the ED with chest pain that began at 9am.  Patient is complaining of left sided chest pain radiating into upper back and left shoulder.  Patient describes pain as a pressure.  Patient also reports some shortness of breath. Denies cough and fever.

## 2018-04-29 ENCOUNTER — Observation Stay: Payer: Medicare HMO

## 2018-04-29 LAB — CBC
HCT: 38.4 % — ABNORMAL LOW (ref 39.0–52.0)
Hemoglobin: 12.4 g/dL — ABNORMAL LOW (ref 13.0–17.0)
MCH: 30.3 pg (ref 26.0–34.0)
MCHC: 32.3 g/dL (ref 30.0–36.0)
MCV: 93.9 fL (ref 80.0–100.0)
Platelets: 149 10*3/uL — ABNORMAL LOW (ref 150–400)
RBC: 4.09 MIL/uL — ABNORMAL LOW (ref 4.22–5.81)
RDW: 13.7 % (ref 11.5–15.5)
WBC: 5 10*3/uL (ref 4.0–10.5)
nRBC: 0 % (ref 0.0–0.2)

## 2018-04-29 LAB — NM MYOCAR MULTI W/SPECT W/WALL MOTION / EF
LV dias vol: 76 mL (ref 62–150)
LV sys vol: 24 mL
SDS: 0
SRS: 10
SSS: 3
TID: 0.87

## 2018-04-29 LAB — TROPONIN I: Troponin I: 0.03 ng/mL (ref <0.03)

## 2018-04-29 LAB — ECHOCARDIOGRAM COMPLETE
Height: 68.5 in
Weight: 2544 oz

## 2018-04-29 LAB — BASIC METABOLIC PANEL
Anion gap: 6 (ref 5–15)
BUN: 15 mg/dL (ref 8–23)
CO2: 25 mmol/L (ref 22–32)
Calcium: 8.9 mg/dL (ref 8.9–10.3)
Chloride: 110 mmol/L (ref 98–111)
Creatinine, Ser: 1.34 mg/dL — ABNORMAL HIGH (ref 0.61–1.24)
GFR calc Af Amer: 55 mL/min — ABNORMAL LOW (ref >60)
GFR calc non Af Amer: 48 mL/min — ABNORMAL LOW (ref >60)
Glucose, Bld: 108 mg/dL — ABNORMAL HIGH (ref 70–99)
Potassium: 4.2 mmol/L (ref 3.5–5.1)
Sodium: 141 mmol/L (ref 135–145)

## 2018-04-29 LAB — LIPID PANEL
Cholesterol: 150 mg/dL (ref 0–200)
HDL: 36 mg/dL — ABNORMAL LOW (ref >40)
LDL Cholesterol: 94 mg/dL (ref 0–99)
Total CHOL/HDL Ratio: 4.2 RATIO
Triglycerides: 100 mg/dL (ref <150)
VLDL: 20 mg/dL (ref 0–40)

## 2018-04-29 MED ORDER — TECHNETIUM TC 99M MEDRONATE IV KIT: 11.2500 | PACK | Freq: Once | INTRAVENOUS | Status: DC | PRN

## 2018-04-29 MED ORDER — REGADENOSON 0.4 MG/5ML IV SOLN
0.4000 mg | Freq: Once | INTRAVENOUS | Status: AC
  Administered 2018-04-29: 0.4 mg via INTRAVENOUS
  Filled 2018-04-29: qty 5

## 2018-04-29 MED ORDER — TECHNETIUM TC 99M TETROFOSMIN IV KIT
31.1500 | PACK | Freq: Once | INTRAVENOUS | Status: AC | PRN
  Administered 2018-04-29: 31.15 via INTRAVENOUS

## 2018-04-29 MED ORDER — TECHNETIUM TC 99M TETROFOSMIN IV KIT
11.2500 | PACK | Freq: Once | INTRAVENOUS | Status: AC | PRN
  Administered 2018-04-29: 11.25 via INTRAVENOUS

## 2018-04-29 MED ORDER — FERROUS FUMARATE 324 (106 FE) MG PO TABS
1.0000 | ORAL_TABLET | Freq: Every day | ORAL | Status: DC
  Administered 2018-04-29: 106 mg via ORAL
  Filled 2018-04-29: qty 1

## 2018-04-29 NOTE — Discharge Instructions (Signed)
Resume diet and activity as before ° ° °

## 2018-04-29 NOTE — Plan of Care (Signed)

## 2018-04-29 NOTE — Progress Notes (Signed)
Pt given discharge instructions. Patient verbalized understanding with no questions or concerns. Wife will be picking up patient. Tele monitor off and iv taken out.

## 2018-04-29 NOTE — Progress Notes (Addendum)
Patient back up to room after stress test. Vital signs taken. Currently pain free,no complaints. Verified with cardio and Dr. Darvin Neighbours about patients diet. Per MD's he can eat.

## 2018-04-29 NOTE — Consult Note (Addendum)
[facility] Cardiology  CARDIOLOGY CONSULT NOTE  Patient ID: Scott Hudson MRN: [id number] DOB/AGE: [birth year] 85 y.o.  Admit date: 04/28/2018 Referring Physician Wynn Banker, MD Primary Physician Benita Stabile, MD  Primary Cardiologist Bartholome Bill, MD Reason for Consultation Chest pain   HPI:  Scott Hudson is a 85 y.o. with a past medical history of normal coronary arteries on cath from 11/2017, HTN, HLD, diabetes mellitus, peripheral vascular disease with percutaneous transluminal angioplasty and stent placement to right superficial femoral and popliteal arteries in 11/2014, who presented to the ED yesterday with chest pain that begun one hour prior to his arrival. He was getting dressed when he begun having tightness in his upper back that radiated down his left arm and to his upper chest. He had no associated nausea, vomiting, shortness of breath, or diaphoresis. The pain was constant for most of the day yesterday, resolving spontaneously last night. He has endorsed many episodes of similar chest pain in the past, however, yesterday's episode was described as feeling more severe than his usual occurrences.   He underwent left heart cath on 11/2017 which revealed no significant coronary disease. Follows up with Dr. Delana Meyer on a regular basis for his peripheral vascular disease.   Troponin values: Negative, 0.05, 0.03 CXR with evidence of left basilar atelectasis  CTA Chest - No evidence of PE, pulmonary edema, or consolidation   Review of systems complete and found to be negative unless listed above   Past Medical History:  Diagnosis Date  . Anemia   . Diabetes mellitus without complication ([place])   . GERD (gastroesophageal reflux disease)   . Hypercholesteremia   . Hypertension   . Peripheral vascular disease ([place])   . Thrombocytopenia ([place])     Past Surgical History:  Procedure Laterality Date  . COLONOSCOPY    . COLONOSCOPY WITH PROPOFOL N/A 11/08/2015   Procedure:  COLONOSCOPY WITH PROPOFOL;  Surgeon: Lucilla Lame, MD;  Location: [facility] ENDOSCOPY;  Service: Endoscopy;  Laterality: N/A;  . LEFT HEART CATH AND CORONARY ANGIOGRAPHY Left 12/03/2017   Procedure: LEFT HEART CATH AND CORONARY ANGIOGRAPHY;  Surgeon: Teodoro Spray, MD;  Location: [place] [facility];  Service: Cardiovascular;  Laterality: Left;  . PERIPHERAL VASCULAR CATHETERIZATION  11/30/2014   Procedure: Lower Extremity Intervention;  Surgeon: Katha Cabal, MD;  Location: [place] [facility];  Service: Cardiovascular;;  . PERIPHERAL VASCULAR CATHETERIZATION N/A 11/30/2014   Procedure: Abdominal Aortogram w/Lower Extremity;  Surgeon: Katha Cabal, MD;  Location: [place] [facility];  Service: Cardiovascular;  Laterality: N/A;  . rt hand trama, tips of two fingers removed      Medications Prior to Admission  Medication Sig Dispense Refill Last Dose  . atorvastatin (LIPITOR) 20 MG tablet Take 20 mg by mouth every evening.  4 04/27/2018 at 1800  . ferrous fumarate-iron polysaccharide complex (TANDEM) 162-115.2 MG CAPS capsule Take 1 capsule by mouth daily with breakfast. 30 capsule 6 04/28/2018 at 0800  . hydrALAZINE (APRESOLINE) 10 MG tablet Take 10 mg by mouth daily.   5 04/28/2018 at 0800  . isosorbide mononitrate (IMDUR) 30 MG 24 hr tablet Take 30 mg by mouth daily.  5 04/28/2018 at 0800  . omeprazole (PRILOSEC) 40 MG capsule Take 40 mg by mouth daily. Reported on 06/16/2015   04/28/2018 at 0800  . pantoprazole (PROTONIX) 40 MG tablet Take 1 tablet (40 mg total) by mouth daily. 30 tablet 1 04/28/2018 at 0800  . tamsulosin (FLOMAX) 0.4 MG CAPS capsule Take  0.4 mg by mouth every evening.  5 04/27/2018 at 1800  . traMADol (ULTRAM) 50 MG tablet Take 0.5 tablets (25 mg total) by mouth every 6 (six) hours as needed. 10 tablet 0 prn at prn   Social History   Socioeconomic History  . Marital status: Married    Spouse name: Not on file  . Number of children: Not on file  . Years of education: Not  on file  . Highest education level: Not on file  Occupational History  . Not on file  Social Needs  . Financial resource strain: Not on file  . Food insecurity:    Worry: Not on file    Inability: Not on file  . Transportation needs:    Medical: Not on file    Non-medical: Not on file  Tobacco Use  . Smoking status: Former Smoker    Packs/day: 1.50    Years: 40.00    Pack years: 60.00    Types: Cigarettes    Last attempt to quit: 11/29/2013    Years since quitting: 4.4  . Smokeless tobacco: Never Used  Substance and Sexual Activity  . Alcohol use: No  . Drug use: No  . Sexual activity: Not on file  Lifestyle  . Physical activity:    Days per week: Not on file    Minutes per session: Not on file  . Stress: Not on file  Relationships  . Social connections:    Talks on phone: Not on file    Gets together: Not on file    Attends religious service: Not on file    Active member of club or organization: Not on file    Attends meetings of clubs or organizations: Not on file    Relationship status: Not on file  . Intimate partner violence:    Fear of current or ex partner: Not on file    Emotionally abused: Not on file    Physically abused: Not on file    Forced sexual activity: Not on file  Other Topics Concern  . Not on file  Social History Narrative  . Not on file    Family History  Problem Relation Age of Onset  . Dementia Brother       Review of systems complete and found to be negative unless listed above    PHYSICAL EXAM  General: Thin, well developed, well nourished, in no acute distress HEENT:  Normocephalic and atramatic Neck:  No JVD.  Lungs: Clear bilaterally to auscultation and percussion. Heart: HRRR . Normal S1 and S2 without gallops or murmurs.  Msk:  Back normal, normal gait. Normal strength and tone for age. Extremities: No clubbing, cyanosis or edema.   Neuro: Alert and oriented X 3. Psych:  Good affect, responds appropriately  Labs:    Lab Results  Component Value Date   WBC 5.0 04/29/2018   HGB 12.4 (L) 04/29/2018   HCT 38.4 (L) 04/29/2018   MCV 93.9 04/29/2018   PLT 149 (L) 04/29/2018    Recent Labs  Lab 04/29/18 0413  NA 141  K 4.2  CL 110  CO2 25  BUN 15  CREATININE 1.34*  CALCIUM 8.9  GLUCOSE 108*   Lab Results  Component Value Date   TROPONINI 0.03 (HH) 04/29/2018    Lab Results  Component Value Date   CHOL 150 04/29/2018   Lab Results  Component Value Date   HDL 36 (L) 04/29/2018   Lab Results  Component Value Date  [place] 94 04/29/2018   Lab Results  Component Value Date   TRIG 100 04/29/2018   Lab Results  Component Value Date   CHOLHDL 4.2 04/29/2018   No results found for: LDLDIRECT    Radiology: Dg Chest 2 View  Result Date: 04/28/2018 CLINICAL DATA:  Chest pain. EXAM: CHEST - 2 VIEW COMPARISON:  Radiographs of June 13, 2015. FINDINGS: The heart size and mediastinal contours are within normal limits. Atherosclerosis of thoracic aorta is noted. Right lung is clear. No pneumothorax or significant pleural effusion is noted. Stable elevated left hemidiaphragm is noted with mild left basilar subsegmental atelectasis. The visualized skeletal structures are unremarkable. IMPRESSION: Stable elevated left hemidiaphragm is noted with mild left basilar subsegmental atelectasis. Aortic Atherosclerosis (ICD10-I70.0). Electronically Signed   By: Marijo Conception M.D.   On: 04/28/2018 12:54   Ct Angio Chest Pe W And/or Wo Contrast  Result Date: 04/28/2018 CLINICAL DATA:  Chest pain and shortness of breath EXAM: CT ANGIOGRAPHY CHEST WITH CONTRAST TECHNIQUE: Multidetector CT imaging of the chest was performed using the standard protocol during bolus administration of intravenous contrast. Multiplanar CT image reconstructions and MIPs were obtained to evaluate the vascular anatomy. CONTRAST:  1mL OMNIPAQUE IOHEXOL 350 MG/ML SOLN COMPARISON:  Chest radiograph April 28, 2018 FINDINGS: Cardiovascular:  There is no demonstrable pulmonary embolus. There is no thoracic aortic aneurysm or dissection. There are foci of calcification in the proximal visualized great vessels. There is extensive atherosclerotic calcification and irregular plaque throughout the thoracic aorta, particularly in the descending aorta. There are foci of coronary artery calcification. There is no pericardial effusion or pericardial thickening. The main pulmonary outflow tract measures 3.3 cm in diameter, prominent. Mediastinum/Nodes: Thyroid appears unremarkable. There is no thoracic adenopathy. No esophageal lesions are evident. Lungs/Pleura: There is underlying centrilobular emphysematous change. There is atelectatic change in the left base. There is no appreciable edema or consolidation. Upper Abdomen: There is atherosclerotic plaque in the visualized upper abdominal aorta. There are calcified granulomas in the spleen. Visualized upper abdominal structures otherwise appear unremarkable. Musculoskeletal: There are foci of degenerative change in the thoracic spine. There are no blastic or lytic bone lesions. There are no chest wall lesions. Review of the MIP images confirms the above findings. IMPRESSION: 1. No demonstrable pulmonary embolus. No thoracic aortic aneurysm or dissection. There is extensive aortic atherosclerosis as well as foci of great vessel and coronary artery calcification. 2. Prominence of the main pulmonary outflow tract, a finding felt to be indicative of pulmonary arterial hypertension. 3. Underlying emphysematous change. Atelectatic change noted in the left base. No consolidation. 4.  No demonstrable thoracic adenopathy. 5.  Apparent calcified granulomas in the spleen. Aortic Atherosclerosis (ICD10-I70.0) and Emphysema (ICD10-J43.9). Electronically Signed   By: Lowella Grip III M.D.   On: 04/28/2018 13:37    Telemetry review:  Sinus rhythm, Rate of 100 BPM, no evidence of ST-T wave changes consistent that would  be consistent ischemia   ASSESSMENT AND PLAN:  Mr. Macomber is a 85 year old male with a history of HTN, HLD, DM, and peripheral vascular disease that was admitted for his chest pain. Initial troponin negative, however, second troponin 0.05, and third troponin down trending again to 0.03. EKG without evidence of ischemia. CXR with evidence of left sided atelectasis but CT chest had no evidence of pulmonary edema, consolidation, or PE. Patient is entirely chest pain free at this time. He underwent a cardiac catheterization in 11/2017 with no significant evidence of coronary disease. Given  recent normal cath, no further inpatient cardiac diagnostics indicated at this time.   Lexiscan infusion myopview showing normal myocardial perfusion without ischemia  Plan:  1. Continue imdur for management of chest pain  2. No further cardiac diagnostics indicated at this time  3. Follow up with outpatient cardiologist   The patient's history and exam findings were discussed with Dr. Nehemiah Massed. The plan was made in conjunction with Dr. Nehemiah Massed. And agree with above Signed: Hilbert Odor PA-C 04/29/2018, 8:24 AM

## 2018-05-02 NOTE — Discharge Summary (Signed)
Scott Hudson at [facility] NAME: Scott Hudson    MR#:  [id number]  DATE OF BIRTH:  21-Oct-1929  DATE OF ADMISSION:  04/28/2018 ADMITTING PHYSICIAN: Saundra Shelling, MD  DATE OF DISCHARGE: 04/29/2018  2:30 PM  PRIMARY CARE PHYSICIAN: Albina Billet, MD   ADMISSION DIAGNOSIS:  Chest pain, unspecified type [R07.9]  DISCHARGE DIAGNOSIS:  Active Problems:   Chest pain   SECONDARY DIAGNOSIS:   Past Medical History:  Diagnosis Date  . Anemia   . Diabetes mellitus without complication ([place])   . GERD (gastroesophageal reflux disease)   . Hypercholesteremia   . Hypertension   . Peripheral vascular disease ([place])   . Thrombocytopenia ([place])      ADMITTING HISTORY  HISTORY OF PRESENT ILLNESS: Scott Hudson  is a 85 y.o. male with a known history of diabetes mellitus type 2, GERD, hyperlipidemia, hypertension, peripheral vascular disease presented to the emergency room for chest pain.  Chest pain started this morning retrosternally located sharp in nature 5 out of 10 on a scale of 1-10.  Patient was evaluated in the emergency room first set of troponin is negative.  Hospitalist service was consulted for further care.  HOSPITAL COURSE:   *Atypical chest pain.  Patient was admitted to telemetry floor and troponin repeated which stayed stable.  No arrhythmias found on telemetry monitoring.  Seen by cardiology.  A stress test was ordered and done which was low risk study.  Also patient had cardiac catheterization 5 months back which showed no significant CAD.  Patient likely has musculoskeletal chest pain and discharged home to use as needed Tylenol.  Pain improved by time of discharge.  No change in medications advised by cardiology.  Patient ambulated prior to discharge with no problems  Other comorbidities remained stable.  Discharged home in stable condition to follow-up with PCP and his cardiologist  CONSULTS OBTAINED:    DRUG ALLERGIES:  No  Known Allergies  DISCHARGE MEDICATIONS:   Allergies as of 04/29/2018   No Known Allergies     Medication List    TAKE these medications   atorvastatin 20 MG tablet Commonly known as:  LIPITOR Take 20 mg by mouth every evening.   ferrous fumarate-iron polysaccharide complex 162-115.2 MG Caps capsule Commonly known as:  TANDEM Take 1 capsule by mouth daily with breakfast.   hydrALAZINE 10 MG tablet Commonly known as:  APRESOLINE Take 10 mg by mouth daily.   isosorbide mononitrate 30 MG 24 hr tablet Commonly known as:  IMDUR Take 30 mg by mouth daily.   omeprazole 40 MG capsule Commonly known as:  PRILOSEC Take 40 mg by mouth daily. Reported on 06/16/2015   pantoprazole 40 MG tablet Commonly known as:  Protonix Take 1 tablet (40 mg total) by mouth daily.   tamsulosin 0.4 MG Caps capsule Commonly known as:  FLOMAX Take 0.4 mg by mouth every evening.   traMADol 50 MG tablet Commonly known as:  Ultram Take 0.5 tablets (25 mg total) by mouth every 6 (six) hours as needed.       Today   VITAL SIGNS:  Blood pressure (!) 165/86, pulse 75, temperature (!) 97.3 F (36.3 C), temperature source Oral, resp. rate 19, height 5' 8.5" (1.74 m), weight 72.1 kg, SpO2 94 %.  I/O:  No intake or output data in the 24 hours ending 05/02/18 2130  PHYSICAL EXAMINATION:  Physical Exam  GENERAL:  85 y.o.-year-old patient lying in the bed with no acute  distress.  LUNGS: Normal breath sounds bilaterally, no wheezing, rales,rhonchi or crepitation. No use of accessory muscles of respiration.  CARDIOVASCULAR: S1, S2 normal. No murmurs, rubs, or gallops.  ABDOMEN: Soft, non-tender, non-distended. Bowel sounds present. No organomegaly or mass.  NEUROLOGIC: Moves all 4 extremities. PSYCHIATRIC: The patient is alert and oriented x 3.  SKIN: No obvious rash, lesion, or ulcer.   DATA REVIEW:   CBC Recent Labs  Lab 04/29/18 0413  WBC 5.0  HGB 12.4*  HCT 38.4*  PLT 149*     Chemistries  Recent Labs  Lab 04/29/18 0413  NA 141  K 4.2  CL 110  CO2 25  GLUCOSE 108*  BUN 15  CREATININE 1.34*  CALCIUM 8.9    Cardiac Enzymes Recent Labs  Lab 04/29/18 0413  TROPONINI 0.03*    Microbiology Results  No results found for this or any previous visit.  RADIOLOGY:  No results found.  Follow up with PCP in 1 week.  Management plans discussed with the patient, family and they are in agreement.  CODE STATUS:  Code Status History    Date Active Date Inactive Code Status Order ID Comments User Context   04/28/2018 1613 04/29/2018 1736 Full Code [id number]  Saundra Shelling, MD Inpatient   12/03/2017 1100 12/03/2017 1623 Full Code [id number]  Teodoro Spray, MD Inpatient   11/30/2014 1236 11/30/2014 1555 Full Code [id number]  Schnier, Dolores Lory, MD Inpatient      TOTAL TIME TAKING CARE OF THIS PATIENT ON DAY OF DISCHARGE: more than 30 minutes.   Scott Hudson M.D on 05/02/2018 at 9:30 PM  Between 7am to 6pm - Pager - [phone]  After 6pm go to [email or web address] - password [email or web address] [email or web address]  Office  [phone]  CC: Primary care physician; Albina Billet, MD  Note: This dictation was prepared with Dragon dictation along with smaller phrase technology. Any transcriptional errors that result from this process are unintentional.

## 2018-06-09 ENCOUNTER — Other Ambulatory Visit: Payer: Self-pay | Admitting: Internal Medicine

## 2018-06-09 DIAGNOSIS — R1013 Epigastric pain: Secondary | ICD-10-CM

## 2018-06-20 ENCOUNTER — Ambulatory Visit
Admission: RE | Admit: 2018-06-20 | Discharge: 2018-06-20 | Disposition: A | Discharge status: Home / Self Care | Payer: Medicare HMO | Source: Ambulatory Visit | Attending: Internal Medicine | Admitting: Internal Medicine

## 2018-06-20 ENCOUNTER — Other Ambulatory Visit: Payer: Self-pay | Admitting: Internal Medicine

## 2018-06-20 ENCOUNTER — Other Ambulatory Visit: Payer: Self-pay

## 2018-06-20 DIAGNOSIS — R1013 Epigastric pain: Secondary | ICD-10-CM

## 2018-06-24 ENCOUNTER — Other Ambulatory Visit: Payer: Self-pay | Admitting: Internal Medicine

## 2018-06-24 DIAGNOSIS — R1319 Other dysphagia: Secondary | ICD-10-CM

## 2018-08-01 ENCOUNTER — Ambulatory Visit: Payer: Medicare HMO

## 2019-01-20 ENCOUNTER — Emergency Department
Admission: EM | Admit: 2019-01-20 | Discharge: 2019-01-20 | Disposition: A | Discharge status: Home / Self Care | Payer: Medicare HMO | Attending: Emergency Medicine | Admitting: Emergency Medicine

## 2019-01-20 ENCOUNTER — Encounter: Payer: Self-pay | Admitting: Emergency Medicine

## 2019-01-20 ENCOUNTER — Emergency Department: Payer: Medicare HMO

## 2019-01-20 ENCOUNTER — Other Ambulatory Visit: Payer: Self-pay

## 2019-01-20 DIAGNOSIS — I10 Essential (primary) hypertension: Secondary | ICD-10-CM | POA: Insufficient documentation

## 2019-01-20 DIAGNOSIS — R079 Chest pain, unspecified: Secondary | ICD-10-CM | POA: Insufficient documentation

## 2019-01-20 DIAGNOSIS — E119 Type 2 diabetes mellitus without complications: Secondary | ICD-10-CM | POA: Diagnosis not present

## 2019-01-20 DIAGNOSIS — Z79899 Other long term (current) drug therapy: Secondary | ICD-10-CM | POA: Diagnosis not present

## 2019-01-20 DIAGNOSIS — Z87891 Personal history of nicotine dependence: Secondary | ICD-10-CM | POA: Diagnosis not present

## 2019-01-20 LAB — TROPONIN I (HIGH SENSITIVITY)
Troponin I (High Sensitivity): 4 ng/L (ref <18)
Troponin I (High Sensitivity): 4 ng/L (ref <18)

## 2019-01-20 LAB — BASIC METABOLIC PANEL
Anion gap: 8 (ref 5–15)
BUN: 15 mg/dL (ref 8–23)
CO2: 25 mmol/L (ref 22–32)
Calcium: 9.6 mg/dL (ref 8.9–10.3)
Chloride: 107 mmol/L (ref 98–111)
Creatinine, Ser: 1.49 mg/dL — ABNORMAL HIGH (ref 0.61–1.24)
GFR calc Af Amer: 48 mL/min — ABNORMAL LOW (ref >60)
GFR calc non Af Amer: 42 mL/min — ABNORMAL LOW (ref >60)
Glucose, Bld: 185 mg/dL — ABNORMAL HIGH (ref 70–99)
Potassium: 4.5 mmol/L (ref 3.5–5.1)
Sodium: 140 mmol/L (ref 135–145)

## 2019-01-20 LAB — CBC
HCT: 39.9 % (ref 39.0–52.0)
Hemoglobin: 13.2 g/dL (ref 13.0–17.0)
MCH: 30.6 pg (ref 26.0–34.0)
MCHC: 33.1 g/dL (ref 30.0–36.0)
MCV: 92.4 fL (ref 80.0–100.0)
Platelets: 171 10*3/uL (ref 150–400)
RBC: 4.32 MIL/uL (ref 4.22–5.81)
RDW: 14.6 % (ref 11.5–15.5)
WBC: 5.6 10*3/uL (ref 4.0–10.5)
nRBC: 0 % (ref 0.0–0.2)

## 2019-01-20 MED ORDER — SODIUM CHLORIDE 0.9% FLUSH: 3.0000 mL | Freq: Once | INTRAVENOUS | Status: DC

## 2019-01-20 NOTE — ED Triage Notes (Signed)
Pt here with c/o left sided chest pain that began yesterday while at rest, bothered him all night, and into the morning today, does not radiate, denies cough or shob. NAD.

## 2019-01-20 NOTE — ED Provider Notes (Signed)
[facility] Emergency Department Provider Note   ____________________________________________   First MD Initiated Contact with Patient 01/20/19 1333     (approximate)  I have reviewed the triage vital signs and the nursing notes.   HISTORY  Chief Complaint Chest Pain    HPI Scott Hudson is a 86 y.o. male with possible history of hypertension, diabetes, and peripheral arterial disease presents to the ED complaining of chest pain.  Patient reports that he developed pain underneath the left lower margin of his ribs overnight last night.  He describes it as less of a pain and more of a fluttering that seems to be intermittent, but not exacerbated or alleviated by anything in particular.  He denies any fevers, cough, or shortness of breath and has not noticed any pain or swelling in his legs.  He states he has had similar discomfort in the past, for which she was evaluated by cardiology with a negative work-up.  He states he is pain-free currently, has not taken any medications for his symptoms.        Past Medical History:  Diagnosis Date  . Anemia   . Diabetes mellitus without complication ([place])   . GERD (gastroesophageal reflux disease)   . Hypercholesteremia   . Hypertension   . Peripheral vascular disease ([place])   . Thrombocytopenia [facility])     Patient Active Problem List   Diagnosis Date Noted  . Chest pain 04/28/2018  . GERD (gastroesophageal reflux disease) 11/04/2016  . Diabetes mellitus type 2 in nonobese ([place]) 05/02/2016  . Hyperlipidemia 05/02/2016  . PAD (peripheral artery disease) ([place]) 05/02/2016  . Absolute anemia   . Benign neoplasm of descending colon   . Iron deficiency anemia due to chronic blood loss 06/17/2015    Past Surgical History:  Procedure Laterality Date  . COLONOSCOPY    . COLONOSCOPY WITH PROPOFOL N/A 11/08/2015   Procedure: COLONOSCOPY WITH PROPOFOL;  Surgeon: Lucilla Lame, MD;  Location: [facility] ENDOSCOPY;   Service: Endoscopy;  Laterality: N/A;  . LEFT HEART CATH AND CORONARY ANGIOGRAPHY Left 12/03/2017   Procedure: LEFT HEART CATH AND CORONARY ANGIOGRAPHY;  Surgeon: Teodoro Spray, MD;  Location: [place] CV LAB;  Service: Cardiovascular;  Laterality: Left;  . PERIPHERAL VASCULAR CATHETERIZATION  11/30/2014   Procedure: Lower Extremity Intervention;  Surgeon: Katha Cabal, MD;  Location: [place] CV LAB;  Service: Cardiovascular;;  . PERIPHERAL VASCULAR CATHETERIZATION N/A 11/30/2014   Procedure: Abdominal Aortogram w/Lower Extremity;  Surgeon: Katha Cabal, MD;  Location: [place] CV LAB;  Service: Cardiovascular;  Laterality: N/A;  . rt hand trama, tips of two fingers removed      Prior to Admission medications   Medication Sig Start Date End Date Taking? Authorizing Provider  atorvastatin (LIPITOR) 20 MG tablet Take 20 mg by mouth every evening. 12/18/16   [provider]  ferrous fumarate-iron polysaccharide complex (TANDEM) 162-115.2 MG CAPS capsule Take 1 capsule by mouth daily with breakfast. 10/28/15   Cammie Sickle, MD  hydrALAZINE (APRESOLINE) 10 MG tablet Take 10 mg by mouth daily.  06/09/15   [provider]  isosorbide mononitrate (IMDUR) 30 MG 24 hr tablet Take 30 mg by mouth daily. 07/28/15   [provider]  omeprazole (PRILOSEC) 40 MG capsule Take 40 mg by mouth daily. Reported on 06/16/2015    [provider]  pantoprazole (PROTONIX) 40 MG tablet Take 1 tablet (40 mg total) by mouth daily. 02/16/18 02/16/19  Harvest Dark, MD  tamsulosin (FLOMAX) 0.4 MG CAPS capsule Take 0.4 mg by mouth every evening. 05/29/15   [provider]  traMADol (ULTRAM) 50 MG tablet Take 0.5 tablets (25 mg total) by mouth every 6 (six) hours as needed. 02/16/18   Harvest Dark, MD    Allergies Patient has no known allergies.  Family History  Problem Relation Age of Onset  . Dementia Brother     Social History Social  History   Tobacco Use  . Smoking status: Former Smoker    Packs/day: 1.50    Years: 40.00    Pack years: 60.00    Types: Cigarettes    Quit date: 11/29/2013    Years since quitting: 5.1  . Smokeless tobacco: Never Used  Substance Use Topics  . Alcohol use: No  . Drug use: No    Review of Systems  Constitutional: No fever/chills Eyes: No visual changes. ENT: No sore throat. Cardiovascular: Positive for chest pain. Respiratory: Denies shortness of breath. Gastrointestinal: No abdominal pain.  No nausea, no vomiting.  No diarrhea.  No constipation. Genitourinary: Negative for dysuria. Musculoskeletal: Negative for back pain. Skin: Negative for rash. Neurological: Negative for headaches, focal weakness or numbness.  ____________________________________________   PHYSICAL EXAM:  VITAL SIGNS: ED Triage Vitals [01/20/19 1121]  Enc Vitals Group     BP (!) 150/60     Pulse Rate 84     Resp 16     Temp 98.7 F (37.1 C)     Temp Source Oral     SpO2 94 %     Weight 161 lb (73 kg)     Height 5\' 8"  (1.727 m)     Head Circumference      Peak Flow      Pain Score 8     Pain Loc      Pain Edu?      Excl. in [place]?     Constitutional: Alert and oriented. Eyes: Conjunctivae are normal. Head: Atraumatic. Nose: No congestion/rhinnorhea. Mouth/Throat: Mucous membranes are moist. Neck: Normal ROM Cardiovascular: Normal rate, regular rhythm. Grossly normal heart sounds. Respiratory: Normal respiratory effort.  No retractions. Lungs CTAB.  No chest wall tenderness to palpation. Gastrointestinal: Soft and nontender. No distention. Genitourinary: deferred Musculoskeletal: No lower extremity tenderness nor edema. Neurologic:  Normal speech and language. No gross focal neurologic deficits are appreciated. Skin:  Skin is warm, dry and intact. No rash noted. Psychiatric: Mood and affect are normal. Speech and behavior are normal.  ____________________________________________     LABS (all labs ordered are listed, but only abnormal results are displayed)  Labs Reviewed  BASIC METABOLIC PANEL - Abnormal; Notable for the following components:      Result Value   Glucose, Bld 185 (*)    Creatinine, Ser 1.49 (*)    GFR calc non Af Amer 42 (*)    GFR calc Af Amer 48 (*)    All other components within normal limits  CBC  TROPONIN I (HIGH SENSITIVITY)  TROPONIN I (HIGH SENSITIVITY)   ____________________________________________  EKG  ED ECG REPORT I, Blake Divine, the attending physician, personally viewed and interpreted this ECG.   Date: 01/20/2019  EKG Time: 11:17  Rate: 81  Rhythm: normal sinus rhythm  Axis: LAD  Intervals:none  ST&T Change: None   PROCEDURES  Procedure(s) performed (including Critical Care):  Procedures   ____________________________________________   INITIAL IMPRESSION / ASSESSMENT AND PLAN / ED COURSE       86 year old male with history of hypertension  and diabetes presents to the ED complaining of left lower chest pain since last night that he describes as a fluttering.  And his symptoms are very atypical sounding.  EKG shows no acute ischemic changes and initial troponin negative.  Reviewing his prior cardiac work-up, he had a clean catheterization in November 2019, also had a low risk stress test in April 2020.  Given atypical symptoms with reassuring work-ups in the past, we will plan to check 2 sets of troponin.  If both of these are negative, he would be appropriate for discharge home with follow-up with cardiology.  Repeat troponin within normal limits and patient remained symptom-free here in the ED.  I have counseled him to follow-up with cardiology or his PCP, otherwise return to the ED for new or worsening symptoms.  Patient agrees with plan.      ____________________________________________   FINAL CLINICAL IMPRESSION(S) / ED DIAGNOSES  Final diagnoses:  Nonspecific chest pain     ED Discharge  Orders    None       Note:  This document was prepared using Dragon voice recognition software and may include unintentional dictation errors.   Blake Divine, MD 01/20/19 [phone]

## 2020-03-17 ENCOUNTER — Other Ambulatory Visit: Payer: Self-pay

## 2020-03-17 ENCOUNTER — Encounter: Payer: Self-pay | Admitting: Podiatry

## 2020-03-17 ENCOUNTER — Ambulatory Visit: Payer: Medicare HMO | Admitting: Podiatry

## 2020-03-17 DIAGNOSIS — L84 Corns and callosities: Secondary | ICD-10-CM | POA: Insufficient documentation

## 2020-03-17 DIAGNOSIS — M79674 Pain in right toe(s): Secondary | ICD-10-CM

## 2020-03-17 DIAGNOSIS — B351 Tinea unguium: Secondary | ICD-10-CM | POA: Diagnosis not present

## 2020-03-17 DIAGNOSIS — M79675 Pain in left toe(s): Secondary | ICD-10-CM | POA: Insufficient documentation

## 2020-03-17 NOTE — Progress Notes (Signed)
This patient returns to my office for at risk foot care.  This patient requires this care by a professional since this patient will be at risk due to having PAD and type 2 diabetes.  Patient has not been seen since 2019. This patient is unable to cut nails himself since the patient cannot reach his nails.These nails are painful walking and wearing shoes. He also has painful corn on the tip of his second toe right foot.   This patient presents for at risk foot care today.  General Appearance  Alert, conversant and in no acute stress.  Vascular  Dorsalis pedis and posterior tibial  pulses are weakly palpable  bilaterally.  Capillary return is within normal limits  bilaterally. Cold feet bilaterally. Absent digital hair.  Neurologic  Senn-Weinstein monofilament wire test within normal limits  bilaterally. Muscle power within normal limits bilaterally.  Nails Thick disfigured discolored nails with subungual debris  from hallux and second toenails  bilaterally. No evidence of bacterial infection or drainage bilaterally.  Orthopedic  No limitations of motion  feet .  No crepitus or effusions noted.  No bony pathology or digital deformities noted.   Skin  normotropic skin with no porokeratosis noted bilaterally.  No signs of infections or ulcers noted.  Clavi second toe right foot.   Onychomycosis  Pain in right toes  Pain in left toes  Consent was obtained for treatment procedures.   Mechanical debridement of nails 1-5  bilaterally performed with a nail nipper.  Filed with dremel without incident. Debride clavi with # 15 blade.   Return office visit    4 months                 Told patient to return for periodic foot care and evaluation due to potential at risk complications.   Gardiner Barefoot DPM

## 2020-03-30 IMAGING — CR DG CHEST 2V
2 series · 2 of 2 positions shown · non-contrast
Comparison: PA and lateral chest and CT chest 04/28/2018.

CLINICAL DATA: Onset left chest pain yesterday.

EXAM:
CHEST - 2 VIEW

[chest pa]
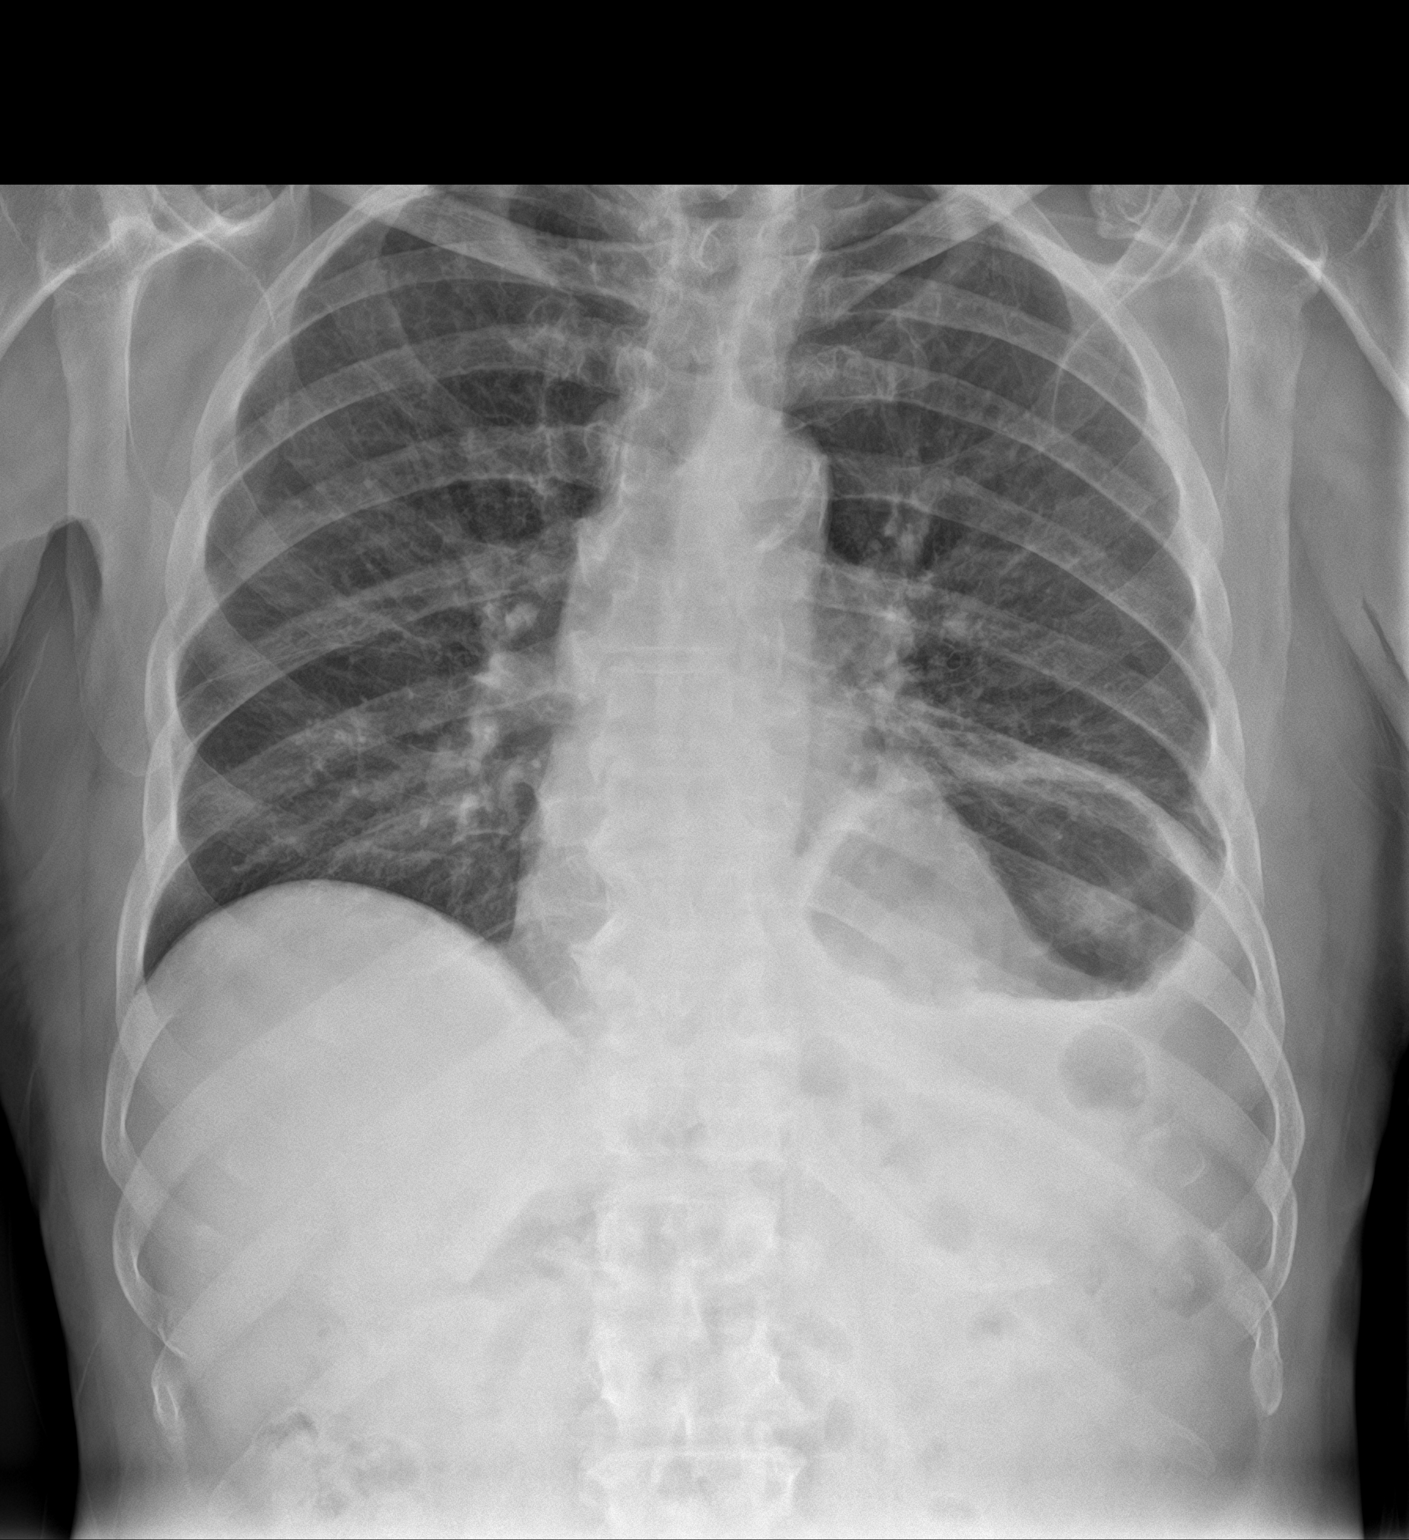

[chest lat]
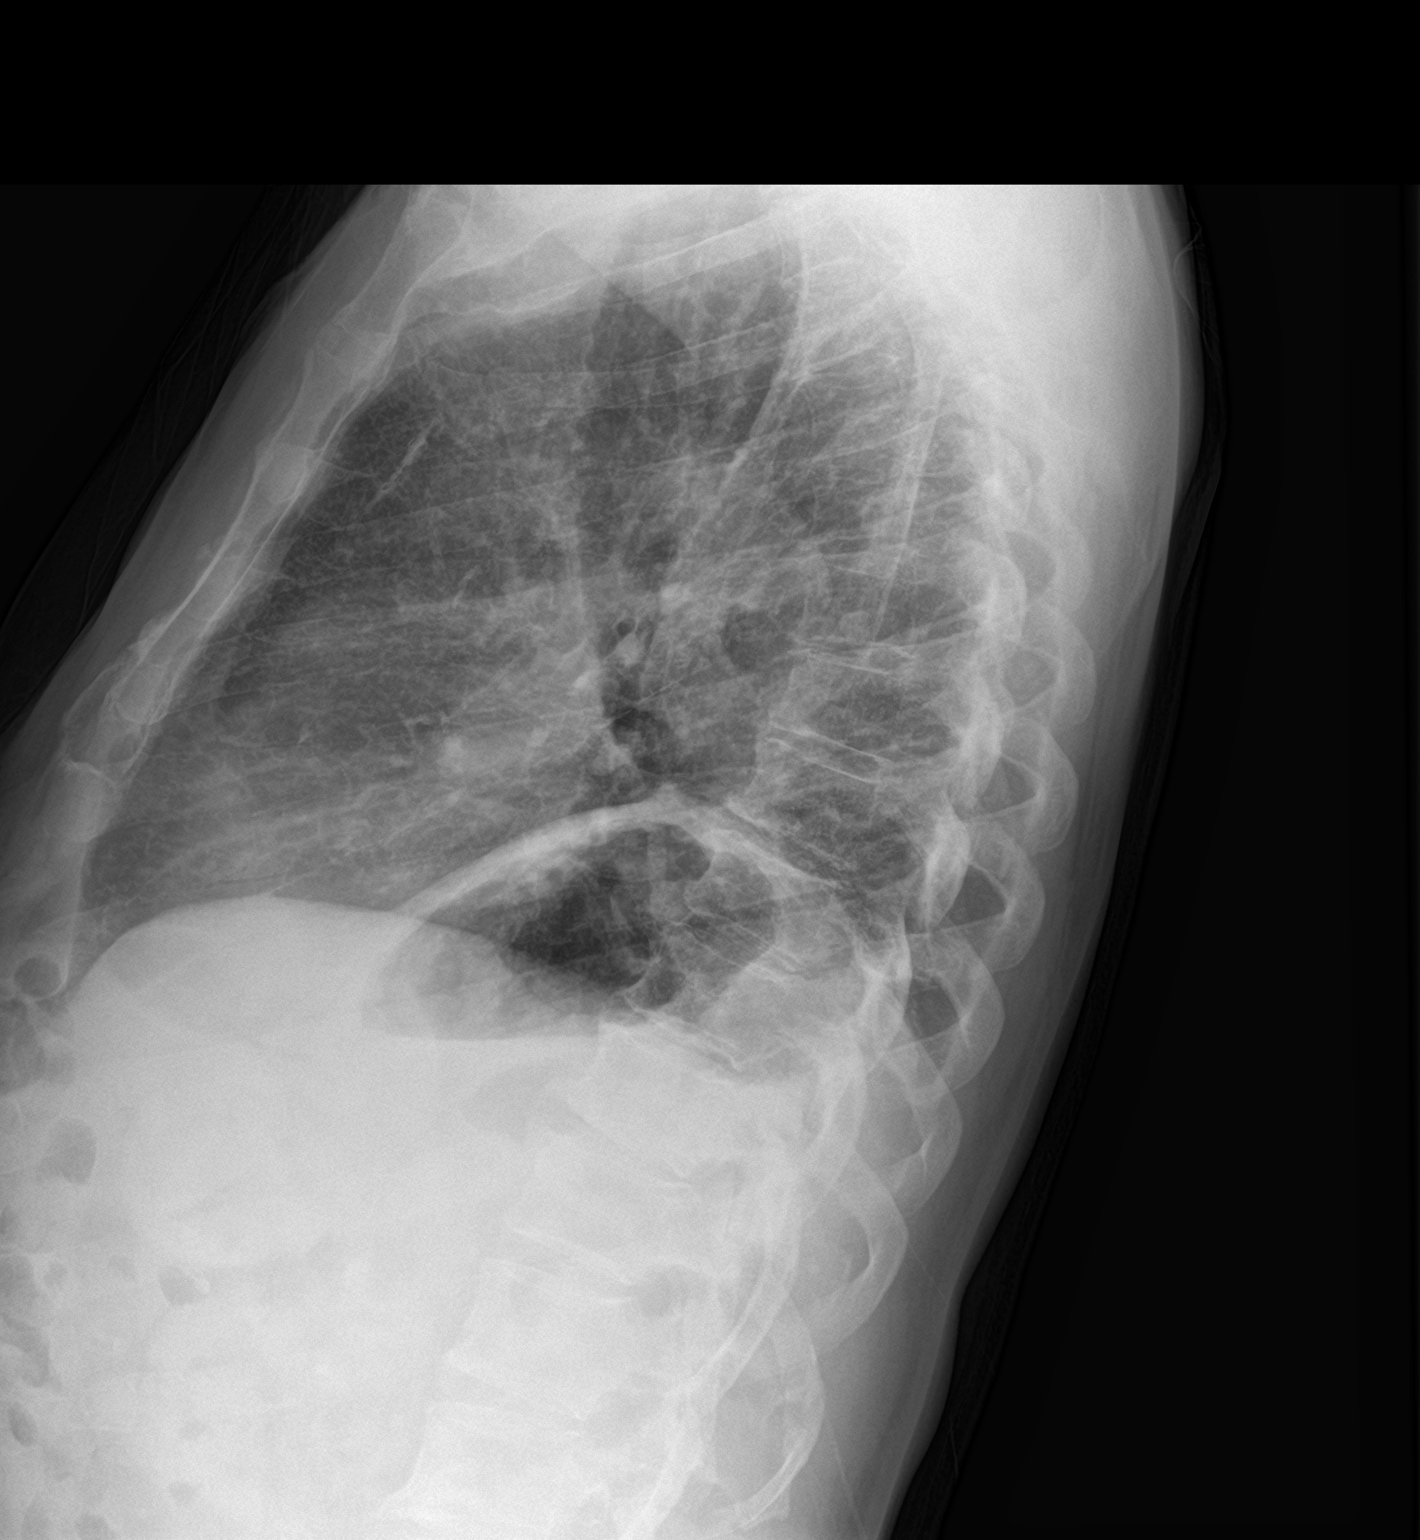

[2 of 2 positions shown; findings below may reference images not displayed]

FINDINGS: The lungs are emphysematous but clear. Heart size is normal. No
pneumothorax or pleural effusion. Eventration left hemidiaphragm is
seen. Aortic atherosclerosis noted. No acute or focal bony
abnormality.
IMPRESSION: Noted no acute disease.

Emphysema.

Atherosclerosis.

## 2020-07-21 ENCOUNTER — Ambulatory Visit: Payer: Medicare HMO | Admitting: Podiatry

## 2021-05-05 ENCOUNTER — Ambulatory Visit (INDEPENDENT_AMBULATORY_CARE_PROVIDER_SITE_OTHER): Payer: Medicare HMO | Admitting: Podiatry

## 2021-05-05 DIAGNOSIS — B351 Tinea unguium: Secondary | ICD-10-CM | POA: Diagnosis not present

## 2021-05-05 DIAGNOSIS — L989 Disorder of the skin and subcutaneous tissue, unspecified: Secondary | ICD-10-CM

## 2021-05-05 DIAGNOSIS — M79675 Pain in left toe(s): Secondary | ICD-10-CM | POA: Diagnosis not present

## 2021-05-05 DIAGNOSIS — M79674 Pain in right toe(s): Secondary | ICD-10-CM

## 2021-05-05 NOTE — Progress Notes (Signed)
? ?  SUBJECTIVE ?Patient PMHx diabetes mellitus without complication presents to office today complaining of elongated, thickened nails that cause pain while ambulating in shoes.  Patient is unable to trim their own nails.  Patient also complains of pain and tenderness to the distal tip of the right second toe.  Patient is here for further evaluation and treatment. ? ?Past Medical History:  ?Diagnosis Date  ? Anemia   ? Diabetes mellitus without complication ([place])   ? GERD (gastroesophageal reflux disease)   ? Hypercholesteremia   ? Hypertension   ? Peripheral vascular disease ([place])   ? Thrombocytopenia ([place])   ? ? ?OBJECTIVE ?General Patient is awake, alert, and oriented x 3 and in no acute distress. ?Derm Skin is dry and supple bilateral. Negative open lesions or macerations. Remaining integument unremarkable. Nails are tender, long, thickened and dystrophic with subungual debris, consistent with onychomycosis, 1-5 bilateral. No signs of infection noted.  There is a hyperkeratotic preulcerative callus tissue noted to the distal tip of the right second toe which is symptomatic ?Vasc  DP and PT pedal pulses palpable bilaterally. Temperature gradient within normal limits.  ?Neuro Epicritic and protective threshold sensation grossly intact bilaterally.  ?Musculoskeletal Exam No symptomatic pedal deformities noted bilateral. Muscular strength within normal limits. ? ?ASSESSMENT ?1.  Pain due to onychomycosis of toenails both ?2.  Preulcerative callus lesion distal tip of the right second toe ?3.  Diabetes mellitus without complication ? ?PLAN OF CARE ?1. Patient evaluated today.  ?2. Instructed to maintain good pedal hygiene and foot care.  ?3. Mechanical debridement of nails 1-5 bilaterally performed using a nail nipper. Filed with dremel without incident.  ?4.  Excisional debridement of the hyperkeratotic preulcerative callus lesion to the right second toe was performed using a tissue nipper without incident or  bleeding.  Patient felt relief  ?5.  Return to clinic in 3 mos.  ? ? ?Edrick Kins, DPM ?[place] ? ?Dr. Edrick Kins, DPM  ?  ?[address]                                     ?[place], [place] [address]                ?Office [phone]  ?Fax [phone] ? ? ? ? ?

## 2021-06-09 ENCOUNTER — Ambulatory Visit: Payer: Medicare HMO | Admitting: Podiatry

## 2021-06-09 DIAGNOSIS — L989 Disorder of the skin and subcutaneous tissue, unspecified: Secondary | ICD-10-CM | POA: Diagnosis not present

## 2021-06-20 NOTE — Progress Notes (Signed)
   SUBJECTIVE Patient PMHx diabetes mellitus without complication presents to office today for follow-up evaluation of a symptomatic callus to the distal tip of the right second toe.  He says that over the last few months the callus has returned.  And has become symptomatic.  Past Medical History:  Diagnosis Date   Anemia    Diabetes mellitus without complication ([facility])    GERD (gastroesophageal reflux disease)    Hypercholesteremia    Hypertension    Peripheral vascular disease ([facility])    Thrombocytopenia ([facility])    Past Surgical History:  Procedure Laterality Date   COLONOSCOPY     COLONOSCOPY WITH PROPOFOL N/A 11/08/2015   Procedure: COLONOSCOPY WITH PROPOFOL;  Surgeon: Lucilla Lame, MD;  Location: [facility] ENDOSCOPY;  Service: Endoscopy;  Laterality: N/A;   LEFT HEART CATH AND CORONARY ANGIOGRAPHY Left 12/03/2017   Procedure: LEFT HEART CATH AND CORONARY ANGIOGRAPHY;  Surgeon: Teodoro Spray, MD;  Location: [place] CV LAB;  Service: Cardiovascular;  Laterality: Left;   PERIPHERAL VASCULAR CATHETERIZATION  11/30/2014   Procedure: Lower Extremity Intervention;  Surgeon: Katha Cabal, MD;  Location: [place] CV LAB;  Service: Cardiovascular;;   PERIPHERAL VASCULAR CATHETERIZATION N/A 11/30/2014   Procedure: Abdominal Aortogram w/Lower Extremity;  Surgeon: Katha Cabal, MD;  Location: [place] [facility] LAB;  Service: Cardiovascular;  Laterality: N/A;   rt hand trama, tips of two fingers removed     No Known Allergies  OBJECTIVE General Patient is awake, alert, and oriented x 3 and in no acute distress. Derm Skin is dry and supple bilateral. Negative open lesions or macerations. Remaining integument unremarkable. Nails are tender, long, thickened and dystrophic with subungual debris, consistent with onychomycosis, 1-5 bilateral. No signs of infection noted.  There is a hyperkeratotic preulcerative callus tissue noted to the distal tip of the right second toe which is  symptomatic Vasc  DP and PT pedal pulses palpable bilaterally. Temperature gradient within normal limits.  Neuro Epicritic and protective threshold sensation grossly intact bilaterally.  Musculoskeletal Exam No symptomatic pedal deformities noted bilateral. Muscular strength within normal limits.  ASSESSMENT 1.  Pain due to onychomycosis of toenails both 2.  Preulcerative callus lesion distal tip of the right second toe 3.  Diabetes mellitus without complication  PLAN OF CARE 1. Patient evaluated today.  2. Instructed to maintain good pedal hygiene and foot care.  3. Excisional debridement of the hyperkeratotic preulcerative callus lesion to the right second toe was performed using a tissue nipper without incident or bleeding.  Patient felt relief  4.  Return to clinic in 3 mos. for routine foot care   Edrick Kins, DPM [facility]  Dr. Edrick Kins, DPM    [address], [place] [address]                Office [phone]  Fax [phone]

## 2022-11-09 ENCOUNTER — Other Ambulatory Visit (INDEPENDENT_AMBULATORY_CARE_PROVIDER_SITE_OTHER): Payer: Self-pay | Admitting: Podiatry

## 2022-11-09 DIAGNOSIS — I739 Peripheral vascular disease, unspecified: Secondary | ICD-10-CM

## 2022-11-21 ENCOUNTER — Ambulatory Visit (INDEPENDENT_AMBULATORY_CARE_PROVIDER_SITE_OTHER): Payer: Medicare HMO

## 2022-11-21 DIAGNOSIS — I739 Peripheral vascular disease, unspecified: Secondary | ICD-10-CM

## 2023-10-17 ENCOUNTER — Other Ambulatory Visit: Payer: Self-pay

## 2023-10-17 ENCOUNTER — Encounter: Payer: Self-pay | Admitting: Internal Medicine

## 2023-10-17 ENCOUNTER — Inpatient Hospital Stay

## 2023-10-17 ENCOUNTER — Inpatient Hospital Stay
Admission: EM | Admit: 2023-10-17 | Discharge: 2023-11-09 | DRG: 207 | Disposition: E | Attending: Student in an Organized Health Care Education/Training Program | Admitting: Student in an Organized Health Care Education/Training Program

## 2023-10-17 ENCOUNTER — Emergency Department

## 2023-10-17 DIAGNOSIS — Z1152 Encounter for screening for COVID-19: Secondary | ICD-10-CM | POA: Diagnosis not present

## 2023-10-17 DIAGNOSIS — E872 Acidosis, unspecified: Secondary | ICD-10-CM | POA: Diagnosis present

## 2023-10-17 DIAGNOSIS — D500 Iron deficiency anemia secondary to blood loss (chronic): Secondary | ICD-10-CM | POA: Diagnosis present

## 2023-10-17 DIAGNOSIS — I469 Cardiac arrest, cause unspecified: Secondary | ICD-10-CM | POA: Diagnosis not present

## 2023-10-17 DIAGNOSIS — K922 Gastrointestinal hemorrhage, unspecified: Secondary | ICD-10-CM | POA: Diagnosis present

## 2023-10-17 DIAGNOSIS — R0602 Shortness of breath: Secondary | ICD-10-CM | POA: Diagnosis present

## 2023-10-17 DIAGNOSIS — Z66 Do not resuscitate: Secondary | ICD-10-CM | POA: Diagnosis not present

## 2023-10-17 DIAGNOSIS — Z87891 Personal history of nicotine dependence: Secondary | ICD-10-CM

## 2023-10-17 DIAGNOSIS — E8770 Fluid overload, unspecified: Secondary | ICD-10-CM | POA: Diagnosis not present

## 2023-10-17 DIAGNOSIS — K7200 Acute and subacute hepatic failure without coma: Secondary | ICD-10-CM | POA: Diagnosis present

## 2023-10-17 DIAGNOSIS — N179 Acute kidney failure, unspecified: Secondary | ICD-10-CM | POA: Diagnosis not present

## 2023-10-17 DIAGNOSIS — E440 Moderate protein-calorie malnutrition: Secondary | ICD-10-CM | POA: Diagnosis present

## 2023-10-17 DIAGNOSIS — D649 Anemia, unspecified: Secondary | ICD-10-CM | POA: Diagnosis not present

## 2023-10-17 DIAGNOSIS — N401 Enlarged prostate with lower urinary tract symptoms: Secondary | ICD-10-CM | POA: Diagnosis present

## 2023-10-17 DIAGNOSIS — M96A3 Multiple fractures of ribs associated with chest compression and cardiopulmonary resuscitation: Secondary | ICD-10-CM | POA: Diagnosis not present

## 2023-10-17 DIAGNOSIS — Z515 Encounter for palliative care: Secondary | ICD-10-CM

## 2023-10-17 DIAGNOSIS — G931 Anoxic brain damage, not elsewhere classified: Secondary | ICD-10-CM | POA: Diagnosis not present

## 2023-10-17 DIAGNOSIS — I10 Essential (primary) hypertension: Secondary | ICD-10-CM | POA: Diagnosis present

## 2023-10-17 DIAGNOSIS — R042 Hemoptysis: Secondary | ICD-10-CM | POA: Diagnosis present

## 2023-10-17 DIAGNOSIS — R7989 Other specified abnormal findings of blood chemistry: Secondary | ICD-10-CM | POA: Diagnosis present

## 2023-10-17 DIAGNOSIS — R748 Abnormal levels of other serum enzymes: Secondary | ICD-10-CM | POA: Diagnosis present

## 2023-10-17 DIAGNOSIS — E870 Hyperosmolality and hypernatremia: Secondary | ICD-10-CM | POA: Diagnosis present

## 2023-10-17 DIAGNOSIS — D62 Acute posthemorrhagic anemia: Secondary | ICD-10-CM | POA: Diagnosis present

## 2023-10-17 DIAGNOSIS — Z79899 Other long term (current) drug therapy: Secondary | ICD-10-CM

## 2023-10-17 DIAGNOSIS — E1122 Type 2 diabetes mellitus with diabetic chronic kidney disease: Secondary | ICD-10-CM | POA: Diagnosis present

## 2023-10-17 DIAGNOSIS — J80 Acute respiratory distress syndrome: Secondary | ICD-10-CM | POA: Diagnosis present

## 2023-10-17 DIAGNOSIS — J90 Pleural effusion, not elsewhere classified: Secondary | ICD-10-CM | POA: Diagnosis present

## 2023-10-17 DIAGNOSIS — R195 Other fecal abnormalities: Secondary | ICD-10-CM | POA: Diagnosis present

## 2023-10-17 DIAGNOSIS — Z860100 Personal history of colon polyps, unspecified: Secondary | ICD-10-CM

## 2023-10-17 DIAGNOSIS — R578 Other shock: Secondary | ICD-10-CM | POA: Diagnosis not present

## 2023-10-17 DIAGNOSIS — J9601 Acute respiratory failure with hypoxia: Secondary | ICD-10-CM | POA: Diagnosis not present

## 2023-10-17 DIAGNOSIS — N35919 Unspecified urethral stricture, male, unspecified site: Secondary | ICD-10-CM | POA: Diagnosis present

## 2023-10-17 DIAGNOSIS — E785 Hyperlipidemia, unspecified: Secondary | ICD-10-CM | POA: Diagnosis present

## 2023-10-17 DIAGNOSIS — I468 Cardiac arrest due to other underlying condition: Secondary | ICD-10-CM | POA: Diagnosis not present

## 2023-10-17 DIAGNOSIS — I130 Hypertensive heart and chronic kidney disease with heart failure and stage 1 through stage 4 chronic kidney disease, or unspecified chronic kidney disease: Secondary | ICD-10-CM | POA: Diagnosis present

## 2023-10-17 DIAGNOSIS — E119 Type 2 diabetes mellitus without complications: Secondary | ICD-10-CM

## 2023-10-17 DIAGNOSIS — R7401 Elevation of levels of liver transaminase levels: Secondary | ICD-10-CM | POA: Diagnosis not present

## 2023-10-17 DIAGNOSIS — I739 Peripheral vascular disease, unspecified: Secondary | ICD-10-CM | POA: Diagnosis not present

## 2023-10-17 DIAGNOSIS — I509 Heart failure, unspecified: Secondary | ICD-10-CM | POA: Diagnosis present

## 2023-10-17 DIAGNOSIS — K219 Gastro-esophageal reflux disease without esophagitis: Secondary | ICD-10-CM | POA: Diagnosis present

## 2023-10-17 DIAGNOSIS — J9584 Transfusion-related acute lung injury (TRALI): Secondary | ICD-10-CM | POA: Diagnosis not present

## 2023-10-17 DIAGNOSIS — J810 Acute pulmonary edema: Secondary | ICD-10-CM | POA: Diagnosis not present

## 2023-10-17 DIAGNOSIS — G9341 Metabolic encephalopathy: Secondary | ICD-10-CM | POA: Diagnosis not present

## 2023-10-17 DIAGNOSIS — J690 Pneumonitis due to inhalation of food and vomit: Secondary | ICD-10-CM | POA: Diagnosis not present

## 2023-10-17 DIAGNOSIS — N1831 Chronic kidney disease, stage 3a: Secondary | ICD-10-CM | POA: Diagnosis present

## 2023-10-17 DIAGNOSIS — R569 Unspecified convulsions: Secondary | ICD-10-CM | POA: Diagnosis not present

## 2023-10-17 DIAGNOSIS — Z6824 Body mass index (BMI) 24.0-24.9, adult: Secondary | ICD-10-CM

## 2023-10-17 DIAGNOSIS — R338 Other retention of urine: Secondary | ICD-10-CM | POA: Diagnosis present

## 2023-10-17 DIAGNOSIS — Z7984 Long term (current) use of oral hypoglycemic drugs: Secondary | ICD-10-CM

## 2023-10-17 DIAGNOSIS — Z59868 Other specified financial insecurity: Secondary | ICD-10-CM

## 2023-10-17 DIAGNOSIS — E1151 Type 2 diabetes mellitus with diabetic peripheral angiopathy without gangrene: Secondary | ICD-10-CM | POA: Diagnosis present

## 2023-10-17 DIAGNOSIS — E7800 Pure hypercholesterolemia, unspecified: Secondary | ICD-10-CM | POA: Diagnosis present

## 2023-10-17 DIAGNOSIS — Z8711 Personal history of peptic ulcer disease: Secondary | ICD-10-CM

## 2023-10-17 LAB — TROPONIN I (HIGH SENSITIVITY)
Troponin I (High Sensitivity): 48 ng/L — ABNORMAL HIGH (ref <18)
Troponin I (High Sensitivity): 53 ng/L — ABNORMAL HIGH (ref <18)

## 2023-10-17 LAB — BASIC METABOLIC PANEL WITH GFR
Anion gap: 13 (ref 5–15)
BUN: 27 mg/dL — ABNORMAL HIGH (ref 8–23)
CO2: 17 mmol/L — ABNORMAL LOW (ref 22–32)
Calcium: 9 mg/dL (ref 8.9–10.3)
Chloride: 109 mmol/L (ref 98–111)
Creatinine, Ser: 1.83 mg/dL — ABNORMAL HIGH (ref 0.61–1.24)
GFR, Estimated: 34 mL/min — ABNORMAL LOW (ref >60)
Glucose, Bld: 140 mg/dL — ABNORMAL HIGH (ref 70–99)
Potassium: 4.2 mmol/L (ref 3.5–5.1)
Sodium: 139 mmol/L (ref 135–145)

## 2023-10-17 LAB — CBC
HCT: 16.5 % — ABNORMAL LOW (ref 39.0–52.0)
Hemoglobin: 4.8 g/dL — CL (ref 13.0–17.0)
MCH: 24.2 pg — ABNORMAL LOW (ref 26.0–34.0)
MCHC: 29.1 g/dL — ABNORMAL LOW (ref 30.0–36.0)
MCV: 83.3 fL (ref 80.0–100.0)
Platelets: 180 K/uL (ref 150–400)
RBC: 1.98 MIL/uL — ABNORMAL LOW (ref 4.22–5.81)
RDW: 17.2 % — ABNORMAL HIGH (ref 11.5–15.5)
WBC: 4.7 K/uL (ref 4.0–10.5)
nRBC: 1.3 % — ABNORMAL HIGH (ref 0.0–0.2)

## 2023-10-17 LAB — GLUCOSE, CAPILLARY
Glucose-Capillary: 217 mg/dL — ABNORMAL HIGH (ref 70–99)
Glucose-Capillary: 211 mg/dL — ABNORMAL HIGH (ref 70–99)

## 2023-10-17 LAB — HEPATIC FUNCTION PANEL
ALT: 17 U/L (ref 0–44)
AST: 32 U/L (ref 15–41)
Albumin: 3.6 g/dL (ref 3.5–5.0)
Alkaline Phosphatase: 56 U/L (ref 38–126)
Bilirubin, Direct: <0.1 mg/dL (ref 0.0–0.2)
Total Bilirubin: 0.4 mg/dL (ref 0.0–1.2)
Total Protein: 6.5 g/dL (ref 6.5–8.1)

## 2023-10-17 LAB — PREPARE RBC (CROSSMATCH)

## 2023-10-17 LAB — BRAIN NATRIURETIC PEPTIDE: B Natriuretic Peptide: 855.7 pg/mL — ABNORMAL HIGH (ref 0.0–100.0)

## 2023-10-17 LAB — LIPASE, BLOOD: Lipase: 55 U/L — ABNORMAL HIGH (ref 11–51)

## 2023-10-17 MED ORDER — ONDANSETRON HCL 4 MG/2ML IJ SOLN: 4.0000 mg | Freq: Four times a day (QID) | INTRAMUSCULAR | Status: DC | PRN

## 2023-10-17 MED ORDER — INSULIN ASPART 100 UNIT/ML IJ SOLN: 0.0000 [IU] | Freq: Three times a day (TID) | INTRAMUSCULAR | Status: DC

## 2023-10-17 MED ORDER — DOCUSATE SODIUM 50 MG/5ML PO LIQD
100.0000 mg | Freq: Two times a day (BID) | ORAL | Status: DC
  Filled 2023-10-17: qty 10

## 2023-10-17 MED ORDER — FENTANYL CITRATE (PF) 50 MCG/ML IJ SOSY
50.0000 ug | PREFILLED_SYRINGE | INTRAMUSCULAR | Status: DC | PRN
  Administered 2023-10-18 (×6): 50 ug via INTRAVENOUS
  Filled 2023-10-17: qty 4
  Filled 2023-10-17: qty 1

## 2023-10-17 MED ORDER — PANTOPRAZOLE SODIUM 40 MG IV SOLR
40.0000 mg | Freq: Two times a day (BID) | INTRAVENOUS | Status: DC
Start: 2023-10-17 — End: 2023-10-22
  Administered 2023-10-18 – 2023-10-22 (×9): 40 mg via INTRAVENOUS
  Filled 2023-10-17 (×9): qty 10

## 2023-10-17 MED ORDER — SUCCINYLCHOLINE CHLORIDE 200 MG/10ML IV SOSY
PREFILLED_SYRINGE | INTRAVENOUS | Status: AC
  Filled 2023-10-17: qty 10

## 2023-10-17 MED ORDER — MORPHINE SULFATE (PF) 2 MG/ML IV SOLN
2.0000 mg | Freq: Once | INTRAVENOUS | Status: AC
  Administered 2023-10-17: 2 mg via INTRAVENOUS
  Filled 2023-10-17: qty 1

## 2023-10-17 MED ORDER — INSULIN ASPART 100 UNIT/ML IJ SOLN
0.0000 [IU] | INTRAMUSCULAR | Status: DC
  Administered 2023-10-18 (×2): 3 [IU] via SUBCUTANEOUS
  Administered 2023-10-18: 2 [IU] via SUBCUTANEOUS
  Administered 2023-10-19: 3 [IU] via SUBCUTANEOUS
  Administered 2023-10-19 – 2023-10-20 (×3): 2 [IU] via SUBCUTANEOUS
  Filled 2023-10-17 (×8): qty 1

## 2023-10-17 MED ORDER — FUROSEMIDE 10 MG/ML IJ SOLN
40.0000 mg | Freq: Once | INTRAMUSCULAR | Status: AC
  Administered 2023-10-17: 40 mg via INTRAVENOUS
  Filled 2023-10-17: qty 4

## 2023-10-17 MED ORDER — FUROSEMIDE 10 MG/ML IJ SOLN
40.0000 mg | Freq: Once | INTRAMUSCULAR | Status: DC
Start: 2023-10-17 — End: 2023-10-17

## 2023-10-17 MED ORDER — ORAL CARE MOUTH RINSE: 15.0000 mL | OROMUCOSAL | Status: DC | PRN

## 2023-10-17 MED ORDER — PANTOPRAZOLE SODIUM 40 MG IV SOLR
40.0000 mg | Freq: Once | INTRAVENOUS | Status: AC
  Administered 2023-10-17: 40 mg via INTRAVENOUS
  Filled 2023-10-17: qty 10

## 2023-10-17 MED ORDER — MIDAZOLAM HCL 2 MG/2ML IJ SOLN
INTRAMUSCULAR | Status: AC
  Filled 2023-10-17: qty 2

## 2023-10-17 MED ORDER — ONDANSETRON HCL 4 MG PO TABS: 4.0000 mg | ORAL_TABLET | Freq: Four times a day (QID) | ORAL | Status: DC | PRN

## 2023-10-17 MED ORDER — FUROSEMIDE 10 MG/ML IJ SOLN
40.0000 mg | INTRAMUSCULAR | Status: AC
  Administered 2023-10-17: 40 mg via INTRAVENOUS
  Filled 2023-10-17: qty 4

## 2023-10-17 MED ORDER — FENTANYL CITRATE (PF) 50 MCG/ML IJ SOSY
PREFILLED_SYRINGE | INTRAMUSCULAR | Status: AC
  Filled 2023-10-17: qty 2

## 2023-10-17 MED ORDER — NOREPINEPHRINE 4 MG/250ML-% IV SOLN
INTRAVENOUS | Status: AC
  Administered 2023-10-18: 2 ug/min via INTRAVENOUS
  Filled 2023-10-17: qty 250

## 2023-10-17 MED ORDER — ROCURONIUM BROMIDE 10 MG/ML (PF) SYRINGE
PREFILLED_SYRINGE | INTRAVENOUS | Status: AC
  Filled 2023-10-17: qty 10

## 2023-10-17 MED ORDER — SODIUM CHLORIDE 0.9 % IV SOLN
250.0000 mL | INTRAVENOUS | Status: AC
  Administered 2023-10-18: 250 mL via INTRAVENOUS

## 2023-10-17 MED ORDER — FAMOTIDINE 20 MG PO TABS
20.0000 mg | ORAL_TABLET | Freq: Two times a day (BID) | ORAL | Status: DC
Start: 2023-10-18 — End: 2023-10-18

## 2023-10-17 MED ORDER — NOREPINEPHRINE 4 MG/250ML-% IV SOLN: 0.0000 ug/min | INTRAVENOUS | Status: DC

## 2023-10-17 MED ORDER — IOHEXOL 300 MG/ML  SOLN
80.0000 mL | Freq: Once | INTRAMUSCULAR | Status: AC | PRN
  Administered 2023-10-17: 80 mL via INTRAVENOUS

## 2023-10-17 MED ORDER — ETOMIDATE 2 MG/ML IV SOLN
INTRAVENOUS | Status: AC
  Filled 2023-10-17: qty 20

## 2023-10-17 MED ORDER — INSULIN ASPART 100 UNIT/ML IJ SOLN: 0.0000 [IU] | Freq: Every day | INTRAMUSCULAR | Status: DC

## 2023-10-17 MED ORDER — SODIUM CHLORIDE 0.9 % IV SOLN
500.0000 mg | Freq: Once | INTRAVENOUS | Status: AC
  Administered 2023-10-17: 500 mg via INTRAVENOUS
  Filled 2023-10-17: qty 5

## 2023-10-17 MED ORDER — SODIUM CHLORIDE 0.9% IV SOLUTION
Freq: Once | INTRAVENOUS | Status: AC
  Filled 2023-10-17: qty 250

## 2023-10-17 MED ORDER — MIDAZOLAM HCL 2 MG/2ML IJ SOLN
1.0000 mg | INTRAMUSCULAR | Status: DC | PRN
  Administered 2023-10-18: 2 mg via INTRAVENOUS
  Administered 2023-10-18: 1 mg via INTRAVENOUS
  Administered 2023-10-18: 2 mg via INTRAVENOUS
  Administered 2023-10-18: 1 mg via INTRAVENOUS
  Filled 2023-10-17 (×6): qty 2

## 2023-10-17 MED ORDER — POLYETHYLENE GLYCOL 3350 17 G PO PACK: 17.0000 g | PACK | Freq: Every day | ORAL | Status: DC

## 2023-10-17 MED ORDER — FENTANYL CITRATE (PF) 50 MCG/ML IJ SOSY
50.0000 ug | PREFILLED_SYRINGE | INTRAMUSCULAR | Status: DC | PRN
  Administered 2023-10-17: 50 ug via INTRAVENOUS
  Filled 2023-10-17 (×2): qty 1

## 2023-10-17 MED ORDER — CEFTRIAXONE SODIUM 1 G IJ SOLR
1.0000 g | Freq: Once | INTRAMUSCULAR | Status: AC
  Administered 2023-10-17: 1 g via INTRAVENOUS
  Filled 2023-10-17: qty 10

## 2023-10-17 MED ORDER — ORAL CARE MOUTH RINSE
15.0000 mL | OROMUCOSAL | Status: DC
Start: 2023-10-18 — End: 2023-10-22
  Administered 2023-10-18 – 2023-10-22 (×53): 15 mL via OROMUCOSAL

## 2023-10-17 NOTE — Progress Notes (Incomplete)
     SIGNIFICANT EVENT    RAPID RESPONSE -->CODE BLUE  NAME: Scott Hudson MRN: [id number] DOB : [birth year]    Concern as stated by nurse / staff        Gathered Sequence of events 89 year old with DM, HTN, PUD, PVD admitted to the floor a few hours prior with severe anemia of 4.8, for transfusion 2 units PRBCs and plans for GI consult in the a.m. CT A/P w contrast was unremarkable.  Patient got a dose of lasix due to concern for fluid overload. -Nurse reports patient arrived from the ED to the floor complaining of 9 out of 10 back pain -Shortly thereafter rapid response called and subsequently CODE BLUE -Son at bedside during event stated his father had been having low back pain and it had been steadily worsening. He became lethargic and nurse called to bedside whereupon rapid response called-->code blue   Patient Assessment Assessment and interventions -Upon my arrival to the floor, rapid response team at bedside.  Chest compressions in progress.  PEA, no pulse -ED provider arrived shortly thereafter and intubated patient -ROSC achieved after about 12 minutes and 3 rounds of epi with good pulse and blood pressure--please see code log -Discussed with son about high risk of holding again.  Son would like father to remain a full code -Discussed with ICU NP and care transferred to their service   Assessment and  Interventions   Assessment:  Cardiac arrest s/p ROSC GI bleed with severe anemia Severe back pain with concern for intra-abdominal bleed/perforation  Plan: Care being transferred to PCCM X X      CRITICAL CARE Performed by: Delayne LULLA Solian   Total critical care time: 38 minutes  Critical care time was exclusive of separately billable procedures and treating other patients.  Critical care was necessary to treat or prevent imminent or life-threatening deterioration.  Critical care was time spent personally by me on the following activities: development of  treatment plan with patient and/or surrogate as well as nursing, discussions with consultants, evaluation of patient's response to treatment, examination of patient, obtaining history from patient or surrogate, ordering and performing treatments and interventions, ordering and review of laboratory studies, ordering and review of radiographic studies, pulse oximetry and re-evaluation of patient's condition.

## 2023-10-17 NOTE — Progress Notes (Incomplete)
 eLink Physician-Brief Progress Note Patient Name: Scott Hudson DOB: 03/07/1929 MRN: [id number]   Date of Service  10/17/2023  HPI/Events of Note  89 year old male with a history of diabetes, hypertension, peptic ulcer disease, peripheral vascular disease, who presented to the emergency department with shortness of breath and chest pain in the setting of severe anemia with hemoglobin of 4.8.  He was admitted to the floor where unfortunately he had a cardiac arrest, was intubated, and transferred to the ICU for further care.  Patient is hypertensive and tachycardic saturating 91% on 100% FiO2 on the vent.  Initial results consistent with an anion gap metabolic acidosis, elevated creatinine, severe anemia.  Radiograph with diffuse airspace disease increased from previous chest radiograph with diffuse interstitial markings, endotracheal tube in appropriate positioning.  OG catheter passes below the diaphragm.  eICU Interventions  Maintain pantoprazole  IV twice daily, DC Colace, convert PEG to as needed, consider discontinuation of concurrent famotidine  Transfuse for hemoglobin less than 7  Maintain mechanical ventilation, daily spontaneous awakening/breathing trials.  Intermittent fentanyl /Versed   Norepinephrine as needed to maintain MAP greater than 65  DVT prophylaxis with SCDs GI prophylaxis with therapeutic pantoprazole    0616 -concern for refractory hypoxemia despite escalating pressure control/PEEP.  Switched to by mental ventilation with adequate oxygenation.  Will obtain gas in 30 minutes.  Intervention Category Evaluation Type: New Patient Evaluation  [name] 10/17/2023, 11:52 PM

## 2023-10-17 NOTE — ED Notes (Signed)
 Pt to ct

## 2023-10-17 NOTE — H&P (Signed)
 History and Physical    Patient: Scott Hudson FMW:[id number] DOB: [birth year] DOA: 10/17/2023 DOS: the patient was seen and examined on 10/17/2023 PCP: Corlis Honor BROCKS, MD  Patient coming from: Home  Chief Complaint:  Chief Complaint  Patient presents with   Shortness of Breath   HPI: Scott Hudson is a 89 y.o. male with medical history significant of non-insulin-dependent diabetes, hyperlipidemia, essential hypertension, peripheral vascular disease, history of peptic ulcer disease, history of iron  deficiency anemia, history of thrombocytopenia, GERD, who presented to the ER complaining of shortness of breath.  Patient is also complaining of some chest pain mostly exertional.  Symptoms have been going on on and off for the last few days.  Little exertion including using the bathroom was bringing the symptoms on.  When he arrived the ER his oxygen sat was 93% on room air.  He was noted to have bilateral lower extremity edema and distended abdomen.  Initial vitals were stable blood glucose was 188.  Hemoglobin was found to be 4.8.  Patient is also guaiac positive.  At this point he is being admitted with symptomatic anemia secondary to GI bleed.  Review of Systems: As mentioned in the history of present illness. All other systems reviewed and are negative. Past Medical History:  Diagnosis Date   Anemia    Diabetes mellitus without complication (HCC)    GERD (gastroesophageal reflux disease)    Hypercholesteremia    Hypertension    Peripheral vascular disease    Thrombocytopenia    Past Surgical History:  Procedure Laterality Date   COLONOSCOPY     COLONOSCOPY WITH PROPOFOL  N/A 11/08/2015   Procedure: COLONOSCOPY WITH PROPOFOL ;  Surgeon: Rogelia Copping, MD;  Location: [facility] ENDOSCOPY;  Service: Endoscopy;  Laterality: N/A;   LEFT HEART CATH AND CORONARY ANGIOGRAPHY Left 12/03/2017   Procedure: LEFT HEART CATH AND CORONARY ANGIOGRAPHY;  Surgeon: Bosie Vinie LABOR, MD;  Location: [facility]  INVASIVE CV LAB;  Service: Cardiovascular;  Laterality: Left;   PERIPHERAL VASCULAR CATHETERIZATION  11/30/2014   Procedure: Lower Extremity Intervention;  Surgeon: Cordella KANDICE Shawl, MD;  Location: [facility] INVASIVE CV LAB;  Service: Cardiovascular;;   PERIPHERAL VASCULAR CATHETERIZATION N/A 11/30/2014   Procedure: Abdominal Aortogram w/Lower Extremity;  Surgeon: Cordella KANDICE Shawl, MD;  Location: [facility] INVASIVE CV LAB;  Service: Cardiovascular;  Laterality: N/A;   rt hand trama, tips of two fingers removed     Social History:  reports that he quit smoking about 9 years ago. His smoking use included cigarettes. He started smoking about 49 years ago. He has a 60 pack-year smoking history. He has never used smokeless tobacco. He reports that he does not drink alcohol and does not use drugs.  No Known Allergies  Family History  Problem Relation Age of Onset   Dementia Brother     Prior to Admission medications   Medication Sig Start Date End Date Taking? Authorizing Provider  atorvastatin  (LIPITOR) 20 MG tablet Take 20 mg by mouth every evening. 12/18/16   [provider]  ferrous fumarate -iron  polysaccharide complex (TANDEM) 162-115.2 MG CAPS capsule Take 1 capsule by mouth daily with breakfast. 10/28/15   Brahmanday, Govinda R, MD  hydrALAZINE  (APRESOLINE ) 10 MG tablet Take 10 mg by mouth daily.  06/09/15   [provider]  isosorbide  mononitrate (IMDUR ) 30 MG 24 hr tablet Take 30 mg by mouth daily. 07/28/15   [provider]  omeprazole (PRILOSEC) 40 MG capsule Take 40 mg by mouth daily. Reported on 06/16/2015  [provider]  pantoprazole  (PROTONIX ) 40 MG tablet Take 1 tablet (40 mg total) by mouth daily. 02/16/18 02/16/19  Dorothyann Drivers, MD  tamsulosin  (FLOMAX ) 0.4 MG CAPS capsule Take 0.4 mg by mouth every evening. 05/29/15   [provider]  traMADol  (ULTRAM ) 50 MG tablet Take 0.5 tablets (25 mg total) by mouth every 6 (six) hours as needed. 02/16/18    Dorothyann Drivers, MD    Physical Exam: Vitals:   10/17/23 1725 10/17/23 1954 10/17/23 2016 10/17/23 2100  BP:  (!) 113/49 (!) 119/54 (!) 148/54  Pulse:  85 85 (!) 108  Resp:  20 (!) 24 19  Temp:  98.8 F (37.1 C) 98.5 F (36.9 C)   TempSrc:  Oral Oral   SpO2: 93% 94% 92% 93%  Weight:      Height:       Constitutional: Chronically ill looking, NAD, calm, comfortable Eyes: PERRL, lids and conjunctivae pale ENMT: Mucous membranes are moist. Posterior pharynx clear of any exudate or lesions.Normal dentition.  Neck: normal, supple, no masses, no thyromegaly Respiratory: clear to auscultation bilaterally, no wheezing, no crackles. Normal respiratory effort. No accessory muscle use.  Cardiovascular: Sinus tachycardia, no murmurs / rubs / gallops.  1+ pedal edema. 2+ pedal pulses. No carotid bruits.  Abdomen: no tenderness, no masses palpated. No hepatosplenomegaly. Bowel sounds positive.  Musculoskeletal: Good range of motion, no joint swelling or tenderness, Skin: no rashes, lesions, ulcers. No induration Neurologic: CN 2-12 grossly intact. Sensation intact, DTR normal. Strength 5/5 in all 4.  Psychiatric: Normal judgment and insight. Alert and oriented x 3. Normal mood  Data Reviewed:  Temperature 98.8, blood pressure 113/49, pulse 108 respirate of 24 oxygen sats 90% on room air.  White count is 4.7 hemoglobin 4.8.  MCV is 83.3 and platelets 180.  Glucose 140 CO2 of 17 creatinine 1.83.  BNP is 855.7.  Troponin 48 and 53.  Chest x-ray showed cardiac enlargement with mild pulmonary vascular congestion.  Also chronic bronchitic changes in the lungs and increasing infiltration in the left lung base probably small left pleural effusion or pneumonia.  CT abdomen and pelvis shows no acute abnormalities.  EKG shows sinus rhythm with some tachycardia  Assessment and Plan:  #1 symptomatic anemia: Probably slow GI bleed.  Patient denied NSAID use.  Patient has history of peptic ulcer disease and  GERD.  His stool is dark and guaiac positive.  I suspect upper GI bleed.  At this point patient will be admitted.  Will be transfused 2 units of packed red blood cells.  IV Protonix .  GI consult in the morning.  Patient will be placed on telemetry.  #2 type 2 diabetes: Initiate sliding scale insulin.  #3 shortness of breath: Most likely due to symptomatic anemia with fluid overload.  Diurese as needed.  #4 hyperlipidemia: Continue with statin  #5 essential hypertension: Will resume home regimen when stable.  #6 peripheral arterial disease: Will resume home regimen.  #8 history of peptic ulcer disease and GERD: Continue PPI   Advance Care Planning:   Code Status: Full Code   Consults: Dr. Unk, GI  Family Communication: No family at bedside  Severity of Illness: The appropriate patient status for this patient is INPATIENT. Inpatient status is judged to be reasonable and necessary in order to provide the required intensity of service to ensure the patient's safety. The patient's presenting symptoms, physical exam findings, and initial radiographic and laboratory data in the context of their chronic comorbidities is felt  to place them at high risk for further clinical deterioration. Furthermore, it is not anticipated that the patient will be medically stable for discharge from the hospital within 2 midnights of admission.   * I certify that at the point of admission it is my clinical judgment that the patient will require inpatient hospital care spanning beyond 2 midnights from the point of admission due to high intensity of service, high risk for further deterioration and high frequency of surveillance required.*  AuthorBETHA SIM KNOLL, MD 10/17/2023 9:13 PM  For on call review [email or web address].

## 2023-10-17 NOTE — ED Notes (Signed)
 Pt placed on 2L [place], pt O2 between 89-90.

## 2023-10-17 NOTE — ED Triage Notes (Signed)
 Pt comes via EMS from home with c/o sob. Pt states chest pain and some upper belly pain. Pt states that started when he was using the bathroom. Pt states sob with exertion this morning.  93% RA, pt placed on 2L [place] and now at 98% Pt then taken off by ems prior to arrival.   Pt states sob when ems stood and pivoted him. Pt  abdomen looks little distended. Pt has swelling in left leg also. Pt states increased weakness over last two days.   124/60 86 HR 98% on 2L  18-21 CBG 188 97.6 temp oral

## 2023-10-17 NOTE — ED Provider Notes (Signed)
 [facility] Provider Note    Event Date/Time   First MD Initiated Contact with Patient 10/17/23 1719     (approximate)   History   Chief Complaint Shortness of Breath   HPI  Scott Hudson is a 89 y.o. male with past medical history of hypertension, diabetes, PAD, anemia, and GERD who presents to the ED complaining of shortness of breath.  Son at bedside reports that patient has been complaining of chest discomfort and shortness of breath intermittently over the past 2 days.  He also states that patient has seemed weaker than usual with intermittent difficulty picking up items with either hand.  He endorses a dry cough but denies any fevers or chills.  He states that his breathing feels okay currently at rest, does report some sharp discomfort in his chest.  He has not noticed any recent bleeding, denies any dark tarry stool.     Physical Exam   Triage Vital Signs: ED Triage Vitals  Encounter Vitals Group     BP 10/17/23 1638 (!) 114/48     Girls Systolic BP Percentile --      Girls Diastolic BP Percentile --      Boys Systolic BP Percentile --      Boys Diastolic BP Percentile --      Pulse Rate 10/17/23 1638 86     Resp 10/17/23 1638 20     Temp 10/17/23 1638 97.8 F (36.6 C)     Temp Source 10/17/23 1638 Oral     SpO2 10/17/23 1638 90 %     Weight 10/17/23 1638 163 lb (73.9 kg)     Height 10/17/23 1638 5' 8 (1.727 m)     Head Circumference --      Peak Flow --      Pain Score 10/17/23 1624 8     Pain Loc --      Pain Education --      Exclude from Growth Chart --     Most recent vital signs: Vitals:   10/17/23 2016 10/17/23 2100  BP: (!) 119/54 (!) 148/54  Pulse: 85 (!) 108  Resp: (!) 24 19  Temp: 98.5 F (36.9 C)   SpO2: 92% 93%    Constitutional: Alert and oriented. Eyes: Conjunctivae are normal. Head: Atraumatic. Nose: No congestion/rhinnorhea. Mouth/Throat: Mucous membranes are moist.  Cardiovascular: Normal rate,  regular rhythm. Grossly normal heart sounds.  2+ radial pulses bilaterally. Respiratory: Normal respiratory effort.  No retractions. Lungs CTAB. Gastrointestinal: Soft and diffusely tender to palpation with no rebound or guarding. No distention.  Rectal exam with dark brown guaiac positive stool. Musculoskeletal: No lower extremity tenderness nor edema.  Neurologic:  Normal speech and language. No gross focal neurologic deficits are appreciated.    ED Results / Procedures / Treatments   Labs (all labs ordered are listed, but only abnormal results are displayed) Labs Reviewed  BASIC METABOLIC PANEL WITH GFR - Abnormal; Notable for the following components:      Result Value   CO2 17 (*)    Glucose, Bld 140 (*)    BUN 27 (*)    Creatinine, Ser 1.83 (*)    GFR, Estimated 34 (*)    All other components within normal limits  CBC - Abnormal; Notable for the following components:   RBC 1.98 (*)    Hemoglobin 4.8 (*)    HCT 16.5 (*)    MCH 24.2 (*)    MCHC 29.1 (*)  RDW 17.2 (*)    nRBC 1.3 (*)    All other components within normal limits  BRAIN NATRIURETIC PEPTIDE - Abnormal; Notable for the following components:   B Natriuretic Peptide 855.7 (*)    All other components within normal limits  TROPONIN I (HIGH SENSITIVITY) - Abnormal; Notable for the following components:   Troponin I (High Sensitivity) 48 (*)    All other components within normal limits  TROPONIN I (HIGH SENSITIVITY) - Abnormal; Notable for the following components:   Troponin I (High Sensitivity) 53 (*)    All other components within normal limits  HEPATIC FUNCTION PANEL  LIPASE, BLOOD  TYPE AND SCREEN  PREPARE RBC (CROSSMATCH)     EKG  ED ECG REPORT I, Carlin Palin, the attending physician, personally viewed and interpreted this ECG.   Date: 10/17/2023  EKG Time: 16:30  Rate: 90  Rhythm: normal sinus rhythm  Axis: LAD  Intervals:none  ST&T Change: None  RADIOLOGY Chest x-ray reviewed and  interpreted by me with left lower lobe infiltrate, no effusion noted.  PROCEDURES:  Critical Care performed: Yes, see critical care procedure note(s)  .Critical Care  Performed by: Palin Carlin, MD Authorized by: Palin Carlin, MD   Critical care provider statement:    Critical care time (minutes):  30   Critical care time was exclusive of:  Separately billable procedures and treating other patients and teaching time   Critical care was necessary to treat or prevent imminent or life-threatening deterioration of the following conditions:  Respiratory failure (Anemia)   Critical care was time spent personally by me on the following activities:  Development of treatment plan with patient or surrogate, discussions with consultants, evaluation of patient's response to treatment, examination of patient, ordering and review of laboratory studies, ordering and review of radiographic studies, ordering and performing treatments and interventions, pulse oximetry, re-evaluation of patient's condition and review of old charts   I assumed direction of critical care for this patient from another provider in my specialty: no     Care discussed with: admitting provider      MEDICATIONS ORDERED IN ED: Medications  0.9 %  sodium chloride  infusion (Manually program via Guardrails IV Fluids) ( Intravenous New Bag/Given 10/17/23 2002)  cefTRIAXone (ROCEPHIN) 1 g in sodium chloride  0.9 % 100 mL IVPB (0 g Intravenous Stopped 10/17/23 1903)  azithromycin (ZITHROMAX) 500 mg in sodium chloride  0.9 % 250 mL IVPB (0 mg Intravenous Stopped 10/17/23 1940)  pantoprazole  (PROTONIX ) injection 40 mg (40 mg Intravenous Given 10/17/23 1832)  iohexol  (OMNIPAQUE ) 300 MG/ML solution 80 mL (80 mLs Intravenous Contrast Given 10/17/23 1943)  furosemide (LASIX) injection 40 mg (40 mg Intravenous Given 10/17/23 2108)     IMPRESSION / MDM / ASSESSMENT AND PLAN / ED COURSE  I reviewed the triage vital signs and the nursing notes.                               89 y.o. male with past medical history of hypertension, diabetes, PAD, anemia, and GERD who presents to the ED complaining of 2 days of generalized weakness with cough, shortness of breath, and chest pain.  Patient's presentation is most consistent with acute presentation with potential threat to life or bodily function.  Differential diagnosis includes, but is not limited to, ACS, PE, pneumonia, pneumothorax, musculoskeletal pain, GERD, anxiety, anemia, electrolyte abnormality, AKI.  Patient nontoxic-appearing and in no acute distress, vital signs remarkable for  oxygen saturation of 90% on room air, placed on 2 L nasal cannula with improvement.  He is not in any respiratory distress, EKG shows no evidence of arrhythmia or ischemia.  He does have diffuse tenderness to palpation of his abdomen, will further assess with CT imaging.  Labs show significant anemia with hemoglobin of 4.8, most recent comparison was normal but from 4 years ago.  We will transfuse 2 units PRBCs, patient does have guaiac positive stool and will give dose of IV Protonix .  Labs additionally show mild AKI as well as mild elevation in troponin, will trend troponin but overall low suspicion for ACS at this time.  Chest x-ray does show pulmonary edema as well as likely left lower lobe pneumonia.  We will treat with IV antibiotics, hold off on Lasix for now given borderline low BP.  CT imaging of the abdomen/pelvis is unremarkable, patient did have increasing oxygen requirement while receiving transfusion and we will diurese with IV Lasix.  BP remains stable at this time, case discussed with hospitalist for admission.      FINAL CLINICAL IMPRESSION(S) / ED DIAGNOSES   Final diagnoses:  Anemia, unspecified type  Gastrointestinal hemorrhage, unspecified gastrointestinal hemorrhage type  Shortness of breath  Acute respiratory failure with hypoxia (HCC)     Rx / DC Orders   ED Discharge Orders      None        Note:  This document was prepared using Dragon voice recognition software and may include unintentional dictation errors.   Willo Dunnings, MD 10/17/23 2110

## 2023-10-17 NOTE — Progress Notes (Signed)
 IV Team at bedside responding to Code Blue, patent's peripheral IV patent. No further intervention needed from IV Team.

## 2023-10-17 NOTE — Consult Note (Incomplete)
 NAME:  Scott Hudson, MRN:  [id number], DOB:  03-14-1929, LOS: 1 ADMISSION DATE:  10/17/2023, CONSULTATION DATE:  10/17/23 REFERRING MD:  Dr. Cleatus, CHIEF COMPLAINT:  Shortness of Breath   History of Present Illness:  89 yo M presenting to [facility] ED from home via EMS for evaluation of dyspnea and chest pain.  History obtained per chart review and family bedside report as patient is intubated and unresponsive at this time post cardiac arrest.  Patient was in his normal state of health until about 2 days prior to arrival (10/7) when he began experiencing shortness of breath particularly with exertion and chest discomfort intermittently. He also noticed generalized weakness. He endorsed dry cough, but denied fever/ chills. The patient on arrival also denied any signs of bleeding including no black/ tarry stools. He has also been complaining of significant lower back pain. Family confirmed no noticed fever/ chills, falls, AMS, LOC, blurred vision, signs of bleeding, abdominal pain/ nausea/ vomiting/ diarrhea, congestion and he has not been around anyone sick.  History of tobacco use, no ETOH/ recreational drug use.   ED course: Upon arrival, patient alert and responsive, afebrile with stable vitals on room air. Labs significant for severe anemia, elevated lipase, elevated BNP and elevated but flat troponin. Imaging showed pulmonary edema, pleural effusion and possible pneumonia. Patient guaiac positive but no frank bleeding. TRH consulted for admission.  Medications given: 40 mg of Lasix, 1 unit pRBC's (2nd ordered), protonix , azithromycin &  Initial Vitals: 97.8, 20, 86, 114/48 & 90% on RA  After admission to the medical floor, patient was sitting on the edge of the bed and talking to his son. The patient became lethargic dropping down to the bed and staring off. Staff was called who found the patient unresponsive with agonal breathing and called a Rapid response. Subsequently, the patient became  pulseless and CODE BLUE team was activated. Initial rhythm was PEA. ACLS protocol initiated and ROSC achieved after about 12 minutes of  suspected downtime. Patient was emergently intubated during the event.  Most recent Significant labs: (Labs/ Imaging personally reviewed) I, Jenita Ruth Rust-Chester, AGACNP-BC, personally viewed and interpreted this ECG. EKG Interpretation: Date: 10/17/23, EKG Time: 16:30, Rate: 90, Rhythm: NSR, QRS Axis:  LAD, Intervals: normal, ST/T Wave abnormalities: none, Narrative Interpretation: NSR with LAD Chemistry: Na+: 139, K+: 4.2, BUN/Cr.: 27/1.83, > 30/2.31 Serum CO2/ AG: 17/ 13 > 14/17 , Lipase: 55 Hematology: WBC: 4.7, Hgb: 4.8 > 6.0,  Troponin: 48 > 53, BNP: 855.7, Lactic/ PCT: >9/pending  ABG: 7.05/ 43/76/11.9 CXR 10/17/23: Cardiac enlargement with mild pulmonary vascular congestion. 2. Chronic bronchitic changes in the lungs. 3. Increasing infiltration in the left lung base likely representing pneumonia. Likely small left pleural effusion. CT abdomen/ pelvis w contrast: no acute abnormality in the abdomen, mild basilar atelectasis and small effusions  PCCM consulted for assistance in management and monitoring due to PEA arrest s/t unclear etiology, acute hypoxic respiratory failure requiring intubation and mechanical ventilatory support in the setting of severe anemia, suspected GIB, flash pulmonary edema and suspected new onset CHF and worsening AGMA.  Pertinent  Medical History  HTN HLD PVD Anemia GERD T2DM Thrombocytopenia Former smoker (60 total pack years)  Significant Hospital Events: Including procedures, antibiotic start and stop dates in addition to other pertinent events   10/17/23: Admit to medical surgical unit with severe anemia s/t suspected GIB. Overnight PEA arrest s/t unclear etiology, acute hypoxic respiratory failure requiring intubation and mechanical ventilatory support in the setting of  flash pulmonary edema and suspected new  onset CHF and worsening AGMA.  Interim History / Subjective:  Patient unresponsive and unable to follow commands. Upon arrival to ICU, significant facial and right thigh rhythmic twitching with correlating right downward gaze noted. Episode lasted > 5 minutes, requiring 3 mg of versed  before ceasing. Son and daughter present, updated on clinical status and plan of care all questions answered at this time.  Bedside POCUS performed with E-link Intensivist assistance. RV & LV function does not appear to be significantly reduced. Diffuse B-lines noted in bilateral lungs. Objective    Blood pressure (!) 106/49, pulse (!) 105, temperature (!) 91.6 F (33.1 C), temperature source Esophageal, resp. rate (!) 21, height 5' 8 (1.727 m), weight 73.9 kg, SpO2 100%.    Vent Mode: PCV FiO2 (%):  [100 %] 100 % Set Rate:  [16 bmp] 16 bmp Vt Set:  [480 mL-500 mL] 500 mL PEEP:  [5 cmH20-8 cmH20] 8 cmH20 Plateau Pressure:  [14 cmH20-21 cmH20] 21 cmH20   Intake/Output Summary (Last 24 hours) at 10/18/2023 0403 Last data filed at 10/18/2023 0200 Gross per 24 hour  Intake 683.93 ml  Output --  Net 683.93 ml   Filed Weights   10/17/23 1638  Weight: 73.9 kg    Examination: General: Adult male, critically ill, lying in bed intubated requiring mechanical ventilation  NAD HEENT: MM pink/moist, anicteric, atraumatic, neck supple Neuro: RASS: -4, unable to follow commands, PERRL +2, rhythmic facial and right thigh twitching with right downward gaze deviation CV: s1s2 irregular, A-fib on monitor, no r/m/g Pulm: Regular, non labored on PRVC 100% & PEEP 8, breath sounds coarse crackles with expiratory wheezing-BUL & diminished-BLL GI: soft, rounded, bs x 4 Skin: no rashes/lesions noted Extremities: warm/dry, pulses + 2 R/P, no edema noted  Resolved problem list   Assessment and Plan  Cardiac arrest: initial rhythm PEA  Circulatory shock Suspected New Onset Atrial Fibrillation  Elevated BNP and  suspected flash pulmonary edema 12 + minutes downtime, suspected anoxic injury POCUS evaluated with E-link intensivist, low suspicion for PE due to RV function. Unable to heparinize due to suspected GIB. Cr >2, will hold off on CT angio at this time - candidate for Normothermia Protocol - Continue vasopressors: levophed & vasopressin PRN, to maintain MAP > 65 - Echocardiogram ordered - f/u BLE doppler US  - consider Amiodarone bolus and infusion if RVR develops - Unable to Heparinize due suspected GIB - f/u TSH & thyroid panel - diurese PRN with lasix as renal function and hemodynamics allow - consult cardiology, appreciate input - Trend troponin, lactic, STAT labs - Cardiology consulted, appreciate input - Hold preadmission medication: hydralazine & Imdur   Acute hypoxic respiratory failure suspect secondary to flash pulmonary edema in the setting of acute encephalopathy and cardiac arrest Suspected CAP Suspected Aspiration  - Ventilator settings: PRVC 8 mL/kg, 100% FiO2, 5 PEEP - Wean PEEP and FiO2 for sats greater than 90% - Plateau pressures less than 30 cm H20 - VAP bundle in place - Intermittent chest x-ray & ABG - Daily WUA/ SBT as tolerated - Ensure adequate pulmonary hygiene  - F/u cultures, trend PCT - Initiate Aspiration Pna coverage: Unasyn  Suspected Seizure activity At risk for anoxic encephalopathy - STAT CT head once stabilized - PAD protocol in place: midazolam  & fentanyl  IVP PRN - RASS goal: 0, -1 - EEG ordered - loaded with Keppra, followed by 500 mg BID - repeat CT head - Neuro consulted  Suspected Acute Kidney Injury on  CKD? Worsening AGMA No history of CKD but renal function in 2020 and 2021 revealed possible CKD stage 3a Baseline Cr: most recent from 2021- 1.49, Cr on admission: 1.83 - Strict I/O's: alert provider if UOP < 0.5 mL/kg/hr - sodium bicarbonate drip started  - f/u beta hydroxy, Daily BMP, replace electrolytes PRN - Avoid nephrotoxic agents  as able, ensure adequate renal perfusion - Consider nephrology consultation if iHD or CRRT indicated  - consider renal US  if trend significantly worsens   Severe Anemia s/t Suspected Gastrointestinal Bleed PMHx: PUD, GERD - continue Protonix  BID - Maintain up to date type & screen - 3rd unit of pRBC's ordered - continuous cardiac monitoring - NPO - Monitor for s/s of bleeding - Daily CBC, PT/ INR monitoring PRN - Transfuse for Hgb <7 - GI consulted, appreciate input  Transaminitis suspect Shock liver secondary to cardiac arrest Mildly Elevated Lipase without evidence of pancreatitis on CT imaging - Trend hepatic function - Consider RUQ US  PRN - avoid hepatotoxic agents - hold statin for now, consider restarting as patient stabilizes  Type 2 Diabetes Mellitus Hemoglobin A1C: pending - Monitor CBG Q 4 hours - SSI moderate dosing - target range while in ICU: 140-180 - follow ICU hyper/hypo-glycemia protocol  Suspected BPH Unable to place foley catheter due to anatomy. Urinary retention. Unable to place coude - Bladder scan Q 6 - if bladder volume reaches > 500 mL prior to day shift will consult urology urgently, otherwise urology consultation in AM for catheter placement  Labs   CBC: Recent Labs  Lab 10/17/23 1639 10/18/23 0029  WBC 4.7 10.7*  HGB 4.8* 6.0*  HCT 16.5* 21.2*  MCV 83.3 86.9  PLT 180 204    Basic Metabolic Panel: Recent Labs  Lab 10/17/23 1639 10/18/23 0029  NA 139 140  K 4.2 4.5  CL 109 109  CO2 17* 14*  GLUCOSE 140* 241*  BUN 27* 30*  CREATININE 1.83* 2.31*  CALCIUM  9.0 8.4*  MG  --  3.2*  PHOS  --  8.5*   GFR: Estimated Creatinine Clearance: 20.2 mL/min (A) (by C-G formula based on SCr of 2.31 mg/dL (H)). Recent Labs  Lab 10/17/23 1639 10/18/23 0029  WBC 4.7 10.7*  LATICACIDVEN  --  >9.0*    Liver Function Tests: Recent Labs  Lab 10/17/23 1639 10/18/23 0029  AST 32 125*  ALT 17 110*  ALKPHOS 56 56  BILITOT 0.4 0.5  PROT  6.5 5.9*  ALBUMIN 3.6 3.1*   Recent Labs  Lab 10/17/23 1639  LIPASE 55*   No results for input(s): AMMONIA in the last 168 hours.  ABG    Component Value Date/Time   PHART 7.05 (LL) 10/18/2023 0030   PCO2ART 43 10/18/2023 0030   PO2ART 76 (L) 10/18/2023 0030   HCO3 11.9 (L) 10/18/2023 0030   ACIDBASEDEF 18.1 (H) 10/18/2023 0030   O2SAT 92.6 10/18/2023 0030     Coagulation Profile: Recent Labs  Lab 10/18/23 0029  INR 1.4*    Cardiac Enzymes: No results for input(s): CKTOTAL, CKMB, CKMBINDEX, TROPONINI in the last 168 hours.  HbA1C: No results found for: HGBA1C  CBG: Recent Labs  Lab 10/17/23 2212 10/17/23 2252 10/18/23 0149  GLUCAP 217* 211* 200*    Review of Systems:   UTA- patient unresponsive on mechanical vent support  Past Medical History:  He,  has a past medical history of Anemia, Diabetes mellitus without complication (HCC), GERD (gastroesophageal reflux disease), Hypercholesteremia, Hypertension, Peripheral vascular disease, and Thrombocytopenia.  Surgical History:   Past Surgical History:  Procedure Laterality Date   COLONOSCOPY     COLONOSCOPY WITH PROPOFOL  N/A 11/08/2015   Procedure: COLONOSCOPY WITH PROPOFOL ;  Surgeon: Rogelia Copping, MD;  Location: [facility] ENDOSCOPY;  Service: Endoscopy;  Laterality: N/A;   LEFT HEART CATH AND CORONARY ANGIOGRAPHY Left 12/03/2017   Procedure: LEFT HEART CATH AND CORONARY ANGIOGRAPHY;  Surgeon: Bosie Vinie LABOR, MD;  Location: [facility] INVASIVE CV LAB;  Service: Cardiovascular;  Laterality: Left;   PERIPHERAL VASCULAR CATHETERIZATION  11/30/2014   Procedure: Lower Extremity Intervention;  Surgeon: Cordella KANDICE Shawl, MD;  Location: [facility] INVASIVE CV LAB;  Service: Cardiovascular;;   PERIPHERAL VASCULAR CATHETERIZATION N/A 11/30/2014   Procedure: Abdominal Aortogram w/Lower Extremity;  Surgeon: Cordella KANDICE Shawl, MD;  Location: [facility] INVASIVE CV LAB;  Service: Cardiovascular;  Laterality: N/A;   rt hand trama,  tips of two fingers removed       Social History:   reports that he quit smoking about 9 years ago. His smoking use included cigarettes. He started smoking about 49 years ago. He has a 60 pack-year smoking history. He has never used smokeless tobacco. He reports that he does not drink alcohol and does not use drugs.   Family History:  His family history includes Dementia in his brother.   Allergies No Known Allergies   Home Medications  Prior to Admission medications   Medication Sig Start Date End Date Taking? Authorizing Provider  atorvastatin  (LIPITOR) 10 MG tablet Take 10 mg by mouth every evening.    [provider]  atorvastatin  (LIPITOR) 20 MG tablet Take 20 mg by mouth every evening. 12/18/16   [provider]  ferrous fumarate -iron  polysaccharide complex (TANDEM) 162-115.2 MG CAPS capsule Take 1 capsule by mouth daily with breakfast. 10/28/15   Brahmanday, Govinda R, MD  hydrALAZINE  (APRESOLINE ) 10 MG tablet Take 10 mg by mouth daily.  06/09/15   [provider]  isosorbide  mononitrate (IMDUR ) 30 MG 24 hr tablet Take 30 mg by mouth daily. 07/28/15   [provider]  omeprazole (PRILOSEC) 40 MG capsule Take 40 mg by mouth daily. Reported on 06/16/2015    [provider]  pantoprazole  (PROTONIX ) 40 MG tablet Take 1 tablet (40 mg total) by mouth daily. 02/16/18 02/16/19  Dorothyann Drivers, MD  tamsulosin  (FLOMAX ) 0.4 MG CAPS capsule Take 0.4 mg by mouth every evening. 05/29/15   [provider]  traMADol  (ULTRAM ) 50 MG tablet Take 0.5 tablets (25 mg total) by mouth every 6 (six) hours as needed. 02/16/18   Dorothyann Drivers, MD     Critical care time: 70 minutes     [name], AGACNP-BC Acute Care Nurse Practitioner [facility]   [phone] / [phone] Please see Amion for details.

## 2023-10-17 NOTE — ED Notes (Signed)
 2L [place] sat @ 88% with adequate pleth, o2 increased to 3L sat at 93% while sleeping

## 2023-10-17 NOTE — ED Provider Notes (Signed)
 Department of Emergency Medicine CODE BLUE CONSULTATION    Code Blue CONSULT NOTE  Chief Complaint: Cardiac arrest/unresponsive   Level V Caveat: Unresponsive  History of present illness: I was contacted by the hospital for a CODE BLUE cardiac arrest upstairs and presented to the patient's bedside.    ROS: Unable to obtain, Level V caveat  Scheduled Meds:  etomidate       fentaNYL        insulin aspart  0-5 Units Subcutaneous QHS   [START ON 10/18/2023] insulin aspart  0-9 Units Subcutaneous TID WC   midazolam        midazolam        pantoprazole  (PROTONIX ) IV  40 mg Intravenous Q12H   rocuronium       succinylcholine       Continuous Infusions: PRN Meds:.etomidate, fentaNYL , midazolam , midazolam , ondansetron  **OR** ondansetron  (ZOFRAN ) IV, rocuronium, succinylcholine Past Medical History:  Diagnosis Date   Anemia    Diabetes mellitus without complication (HCC)    GERD (gastroesophageal reflux disease)    Hypercholesteremia    Hypertension    Peripheral vascular disease    Thrombocytopenia    Past Surgical History:  Procedure Laterality Date   COLONOSCOPY     COLONOSCOPY WITH PROPOFOL  N/A 11/08/2015   Procedure: COLONOSCOPY WITH PROPOFOL ;  Surgeon: Rogelia Copping, MD;  Location: [facility] ENDOSCOPY;  Service: Endoscopy;  Laterality: N/A;   LEFT HEART CATH AND CORONARY ANGIOGRAPHY Left 12/03/2017   Procedure: LEFT HEART CATH AND CORONARY ANGIOGRAPHY;  Surgeon: Bosie Vinie LABOR, MD;  Location: [facility] INVASIVE CV LAB;  Service: Cardiovascular;  Laterality: Left;   PERIPHERAL VASCULAR CATHETERIZATION  11/30/2014   Procedure: Lower Extremity Intervention;  Surgeon: Cordella KANDICE Shawl, MD;  Location: [facility] INVASIVE CV LAB;  Service: Cardiovascular;;   PERIPHERAL VASCULAR CATHETERIZATION N/A 11/30/2014   Procedure: Abdominal Aortogram w/Lower Extremity;  Surgeon: Cordella KANDICE Shawl, MD;  Location: [facility] INVASIVE CV LAB;  Service: Cardiovascular;  Laterality: N/A;   rt hand trama, tips  of two fingers removed     Social History   Socioeconomic History   Marital status: Married    Spouse name: Not on file   Number of children: Not on file   Years of education: Not on file   Highest education level: Not on file  Occupational History   Not on file  Tobacco Use   Smoking status: Former    Current packs/day: 0.00    Average packs/day: 1.5 packs/day for 40.0 years (60.0 ttl pk-yrs)    Types: Cigarettes    Start date: 11/29/1973    Quit date: 11/29/2013    Years since quitting: 9.8   Smokeless tobacco: Never  Substance and Sexual Activity   Alcohol use: No   Drug use: No   Sexual activity: Not on file  Other Topics Concern   Not on file  Social History Narrative   Not on file   Social Drivers of Health   Financial Resource Strain: Medium Risk (05/08/2023)   Received from [facility]   Overall Financial Resource Strain (CARDIA)    Difficulty of Paying Living Expenses: Somewhat hard  Food Insecurity: No Food Insecurity (05/08/2023)   Received from [facility]   Hunger Vital Sign    Within the past 12 months, you worried that your food would run out before you got the money to buy more.: Never true    Within the past 12 months, the food you bought just didn't last and you didn't  have money to get more.: Never true  Transportation Needs: No Transportation Needs (05/08/2023)   Received from [facility] - Transportation    In the past 12 months, has lack of transportation kept you from medical appointments or from getting medications?: No    Lack of Transportation (Non-Medical): No  Physical Activity: Not on file  Stress: Not on file  Social Connections: Not on file  Intimate Partner Violence: Not on file   No Known Allergies  Last set of Vital Signs (not current) Vitals:   10/17/23 2132 10/17/23 2151  BP:  (!) 125/50  Pulse:  (!) 107  Resp:  20  Temp: 97.6 F (36.4 C) 98 F (36.7 C)  SpO2:   92%      Physical Exam  Gen: unresponsive Cardiovascular: pulseless  Resp: apneic. Breath sounds equal bilaterally with bagging  Abd: nondistended  Neuro: GCS 3, unresponsive to pain  HEENT: No blood in posterior pharynx, gag reflex absent  Neck: No crepitus  Musculoskeletal: No deformity  Skin: warm  Procedures  INTUBATION Performed by: Rolland FORBES Moats Required items: required blood products, implants, devices, and special equipment available Patient identity confirmed: provided demographic data and hospital-assigned identification number Time out: Immediately prior to procedure a time out was called to verify the correct patient, procedure, equipment, support staff and site/side marked as required. Indications: Respiratory failure Intubation method: Video larynscope Preoxygenation: BVM Sedatives: None Paralytic: None Tube Size: 7.5 cuffed Post-procedure assessment: chest rise and ETCO2 monitor Breath sounds: equal and absent over the epigastrium Tube secured by Respiratory Therapy Patient tolerated the procedure well with no immediate complications.   Cardiopulmonary Resuscitation (CPR) Procedure Note  Directed/Performed by: ICU attending at bedside, I facilitated airway I personally directed ancillary staff and/or performed CPR in an effort to regain return of spontaneous circulation and to maintain cardiac, neuro and systemic perfusion.    Medical Decision making  None  Assessment and Plan  CPR ongoing in process but airway secured.  Primary team at bedside    Moats Rolland FORBES, MD 10/17/23 2302

## 2023-10-17 NOTE — ED Notes (Signed)
 Fall risk bundle is currently in place.

## 2023-10-17 NOTE — ED Notes (Signed)
 MD Jessup informed hgb of 4.8, acuity level changed

## 2023-10-18 ENCOUNTER — Inpatient Hospital Stay

## 2023-10-18 ENCOUNTER — Other Ambulatory Visit: Payer: Self-pay

## 2023-10-18 ENCOUNTER — Inpatient Hospital Stay: Admit: 2023-10-18

## 2023-10-18 ENCOUNTER — Inpatient Hospital Stay
Admit: 2023-10-18 | Discharge: 2023-10-18 | Disposition: A | Discharge status: Home / Self Care | Attending: Pulmonary Disease | Admitting: Pulmonary Disease

## 2023-10-18 DIAGNOSIS — E8770 Fluid overload, unspecified: Secondary | ICD-10-CM | POA: Diagnosis not present

## 2023-10-18 DIAGNOSIS — I469 Cardiac arrest, cause unspecified: Secondary | ICD-10-CM

## 2023-10-18 DIAGNOSIS — E440 Moderate protein-calorie malnutrition: Secondary | ICD-10-CM | POA: Diagnosis present

## 2023-10-18 DIAGNOSIS — R569 Unspecified convulsions: Secondary | ICD-10-CM | POA: Diagnosis not present

## 2023-10-18 DIAGNOSIS — N35919 Unspecified urethral stricture, male, unspecified site: Secondary | ICD-10-CM

## 2023-10-18 LAB — BLOOD GAS, ARTERIAL
Acid-Base Excess: 0.1 mmol/L (ref 0.0–2.0)
Acid-Base Excess: 0.8 mmol/L (ref 0.0–2.0)
Acid-base deficit: 10.3 mmol/L — ABNORMAL HIGH (ref 0.0–2.0)
Acid-base deficit: 18.1 mmol/L — ABNORMAL HIGH (ref 0.0–2.0)
Acid-base deficit: 3.1 mmol/L — ABNORMAL HIGH (ref 0.0–2.0)
Bicarbonate: 11.9 mmol/L — ABNORMAL LOW (ref 20.0–28.0)
Bicarbonate: 15.6 mmol/L — ABNORMAL LOW (ref 20.0–28.0)
Bicarbonate: 22 mmol/L (ref 20.0–28.0)
Bicarbonate: 24.7 mmol/L (ref 20.0–28.0)
Bicarbonate: 26 mmol/L (ref 20.0–28.0)
FIO2: 0.8 %
FIO2: 100 %
FIO2: 100 %
FIO2: 100 %
FIO2: 80 %
MECHVT: 450 mL
MECHVT: 480 mL
Mechanical Rate: 16
Mechanical Rate: 16
O2 Saturation: 88.8 %
O2 Saturation: 91.9 %
O2 Saturation: 92.6 %
O2 Saturation: 94.5 %
O2 Saturation: 99 %
PEEP: 0 cmH2O
PEEP: 0 cmH2O
PEEP: 8 cmH2O
PEEP: 8 cmH2O
PEEP: 8 cmH2O
Patient temperature: 37
Patient temperature: 37
Patient temperature: 37
Patient temperature: 37
Patient temperature: 37
Pressure control: 12 cmH2O
RATE: 14 {breaths}/min
pCO2 arterial: 34 mmHg (ref 32–48)
pCO2 arterial: 39 mmHg (ref 32–48)
pCO2 arterial: 39 mmHg (ref 32–48)
pCO2 arterial: 43 mmHg (ref 32–48)
pCO2 arterial: 43 mmHg (ref 32–48)
pH, Arterial: 7.05 — CL (ref 7.35–7.45)
pH, Arterial: 7.27 — ABNORMAL LOW (ref 7.35–7.45)
pH, Arterial: 7.36 (ref 7.35–7.45)
pH, Arterial: 7.39 (ref 7.35–7.45)
pH, Arterial: 7.41 (ref 7.35–7.45)
pO2, Arterial: 119 mmHg — ABNORMAL HIGH (ref 83–108)
pO2, Arterial: 54 mmHg — ABNORMAL LOW (ref 83–108)
pO2, Arterial: 61 mmHg — ABNORMAL LOW (ref 83–108)
pO2, Arterial: 61 mmHg — ABNORMAL LOW (ref 83–108)
pO2, Arterial: 76 mmHg — ABNORMAL LOW (ref 83–108)

## 2023-10-18 LAB — URINALYSIS, COMPLETE (UACMP) WITH MICROSCOPIC
Bacteria, UA: NONE SEEN
Bilirubin Urine: NEGATIVE
Glucose, UA: NEGATIVE mg/dL
Ketones, ur: NEGATIVE mg/dL
Leukocytes,Ua: NEGATIVE
Nitrite: NEGATIVE
Protein, ur: 100 mg/dL — AB
RBC / HPF: >50 RBC/hpf (ref 0–5)
Specific Gravity, Urine: 1.038 — ABNORMAL HIGH (ref 1.005–1.030)
pH: 5 (ref 5.0–8.0)

## 2023-10-18 LAB — COMPREHENSIVE METABOLIC PANEL WITH GFR
ALT: 110 U/L — ABNORMAL HIGH (ref 0–44)
ALT: 116 U/L — ABNORMAL HIGH (ref 0–44)
AST: 125 U/L — ABNORMAL HIGH (ref 15–41)
AST: 146 U/L — ABNORMAL HIGH (ref 15–41)
Albumin: 3.1 g/dL — ABNORMAL LOW (ref 3.5–5.0)
Albumin: 3.3 g/dL — ABNORMAL LOW (ref 3.5–5.0)
Alkaline Phosphatase: 56 U/L (ref 38–126)
Alkaline Phosphatase: 60 U/L (ref 38–126)
Anion gap: 17 — ABNORMAL HIGH (ref 5–15)
Anion gap: 17 — ABNORMAL HIGH (ref 5–15)
BUN: 30 mg/dL — ABNORMAL HIGH (ref 8–23)
BUN: 33 mg/dL — ABNORMAL HIGH (ref 8–23)
CO2: 14 mmol/L — ABNORMAL LOW (ref 22–32)
CO2: 18 mmol/L — ABNORMAL LOW (ref 22–32)
Calcium: 8.4 mg/dL — ABNORMAL LOW (ref 8.9–10.3)
Calcium: 8.5 mg/dL — ABNORMAL LOW (ref 8.9–10.3)
Chloride: 109 mmol/L (ref 98–111)
Chloride: 107 mmol/L (ref 98–111)
Creatinine, Ser: 2.31 mg/dL — ABNORMAL HIGH (ref 0.61–1.24)
Creatinine, Ser: 2.21 mg/dL — ABNORMAL HIGH (ref 0.61–1.24)
GFR, Estimated: 26 mL/min — ABNORMAL LOW (ref >60)
GFR, Estimated: 27 mL/min — ABNORMAL LOW (ref >60)
Glucose, Bld: 241 mg/dL — ABNORMAL HIGH (ref 70–99)
Glucose, Bld: 208 mg/dL — ABNORMAL HIGH (ref 70–99)
Potassium: 4.5 mmol/L (ref 3.5–5.1)
Potassium: 4.6 mmol/L (ref 3.5–5.1)
Sodium: 140 mmol/L (ref 135–145)
Sodium: 142 mmol/L (ref 135–145)
Total Bilirubin: 0.5 mg/dL (ref 0.0–1.2)
Total Bilirubin: 0.7 mg/dL (ref 0.0–1.2)
Total Protein: 5.9 g/dL — ABNORMAL LOW (ref 6.5–8.1)
Total Protein: 6.2 g/dL — ABNORMAL LOW (ref 6.5–8.1)

## 2023-10-18 LAB — URINE DRUG SCREEN, QUALITATIVE (ARMC ONLY)
Amphetamines, Ur Screen: NOT DETECTED
Barbiturates, Ur Screen: NOT DETECTED
Benzodiazepine, Ur Scrn: POSITIVE — AB
Cannabinoid 50 Ng, Ur ~~LOC~~: NOT DETECTED
Cocaine Metabolite,Ur ~~LOC~~: NOT DETECTED
MDMA (Ecstasy)Ur Screen: NOT DETECTED
Methadone Scn, Ur: NOT DETECTED
Opiate, Ur Screen: POSITIVE — AB
Phencyclidine (PCP) Ur S: NOT DETECTED
Tricyclic, Ur Screen: NOT DETECTED

## 2023-10-18 LAB — RESPIRATORY PANEL BY PCR

## 2023-10-18 LAB — TROPONIN I (HIGH SENSITIVITY)
Troponin I (High Sensitivity): 76 ng/L — ABNORMAL HIGH (ref <18)
Troponin I (High Sensitivity): 175 ng/L (ref <18)
Troponin I (High Sensitivity): 251 ng/L (ref <18)

## 2023-10-18 LAB — MAGNESIUM
Magnesium: 3.2 mg/dL — ABNORMAL HIGH (ref 1.7–2.4)
Magnesium: 3 mg/dL — ABNORMAL HIGH (ref 1.7–2.4)

## 2023-10-18 LAB — BLOOD GAS, VENOUS
Acid-base deficit: 7 mmol/L — ABNORMAL HIGH (ref 0.0–2.0)
Bicarbonate: 19.7 mmol/L — ABNORMAL LOW (ref 20.0–28.0)
FIO2: 100 %
O2 Saturation: 88.4 %
Patient temperature: 37
pCO2, Ven: 43 mmHg — ABNORMAL LOW (ref 44–60)
pH, Ven: 7.27 (ref 7.25–7.43)
pO2, Ven: 53 mmHg — ABNORMAL HIGH (ref 32–45)

## 2023-10-18 LAB — ECHOCARDIOGRAM COMPLETE
AR max vel: 2.73 cm2
AV Area VTI: 2.62 cm2
AV Area mean vel: 2.63 cm2
AV Mean grad: 3 mmHg
AV Peak grad: 5.7 mmHg
Ao pk vel: 1.2 m/s
Area-P 1/2: 3.91 cm2
Calc EF: 46.8 %
Height: 68 in
MV M vel: 5.11 m/s
MV Peak grad: 104.4 mmHg
MV VTI: 2.29 cm2
S' Lateral: 3.1 cm
Single Plane A2C EF: 44.4 %
Single Plane A4C EF: 44.8 %
Weight: 2608 [oz_av]

## 2023-10-18 LAB — CBC
HCT: 21.2 % — ABNORMAL LOW (ref 39.0–52.0)
Hemoglobin: 6 g/dL — ABNORMAL LOW (ref 13.0–17.0)
MCH: 24.6 pg — ABNORMAL LOW (ref 26.0–34.0)
MCHC: 28.3 g/dL — ABNORMAL LOW (ref 30.0–36.0)
MCV: 86.9 fL (ref 80.0–100.0)
Platelets: 204 K/uL (ref 150–400)
RBC: 2.44 MIL/uL — ABNORMAL LOW (ref 4.22–5.81)
RDW: 16.9 % — ABNORMAL HIGH (ref 11.5–15.5)
WBC: 10.7 K/uL — ABNORMAL HIGH (ref 4.0–10.5)
nRBC: 2.8 % — ABNORMAL HIGH (ref 0.0–0.2)

## 2023-10-18 LAB — BASIC METABOLIC PANEL WITH GFR
Anion gap: 15 (ref 5–15)
BUN: 35 mg/dL — ABNORMAL HIGH (ref 8–23)
CO2: 24 mmol/L (ref 22–32)
Calcium: 8.1 mg/dL — ABNORMAL LOW (ref 8.9–10.3)
Chloride: 105 mmol/L (ref 98–111)
Creatinine, Ser: 2.4 mg/dL — ABNORMAL HIGH (ref 0.61–1.24)
GFR, Estimated: 25 mL/min — ABNORMAL LOW (ref >60)
Glucose, Bld: 147 mg/dL — ABNORMAL HIGH (ref 70–99)
Potassium: 4.7 mmol/L (ref 3.5–5.1)
Sodium: 144 mmol/L (ref 135–145)

## 2023-10-18 LAB — PROTIME-INR
INR: 1.4 — ABNORMAL HIGH (ref 0.8–1.2)
Prothrombin Time: 17.8 s — ABNORMAL HIGH (ref 11.4–15.2)

## 2023-10-18 LAB — GLUCOSE, CAPILLARY
Glucose-Capillary: 100 mg/dL — ABNORMAL HIGH (ref 70–99)
Glucose-Capillary: 119 mg/dL — ABNORMAL HIGH (ref 70–99)
Glucose-Capillary: 124 mg/dL — ABNORMAL HIGH (ref 70–99)
Glucose-Capillary: 170 mg/dL — ABNORMAL HIGH (ref 70–99)
Glucose-Capillary: 200 mg/dL — ABNORMAL HIGH (ref 70–99)
Glucose-Capillary: 98 mg/dL (ref 70–99)

## 2023-10-18 LAB — MRSA NEXT GEN BY PCR, NASAL: MRSA by PCR Next Gen: NOT DETECTED

## 2023-10-18 LAB — LACTIC ACID, PLASMA
Lactic Acid, Venous: >9 mmol/L (ref 0.5–1.9)
Lactic Acid, Venous: 8.7 mmol/L (ref 0.5–1.9)
Lactic Acid, Venous: 6.3 mmol/L (ref 0.5–1.9)
Lactic Acid, Venous: 2.7 mmol/L (ref 0.5–1.9)

## 2023-10-18 LAB — RESP PANEL BY RT-PCR (RSV, FLU A&B, COVID)  RVPGX2
Influenza A by PCR: NEGATIVE
Influenza B by PCR: NEGATIVE
Resp Syncytial Virus by PCR: NEGATIVE
SARS Coronavirus 2 by RT PCR: NEGATIVE

## 2023-10-18 LAB — PREPARE RBC (CROSSMATCH)

## 2023-10-18 LAB — PROCALCITONIN: Procalcitonin: 0.82 ng/mL

## 2023-10-18 LAB — LIPASE, BLOOD: Lipase: 53 U/L — ABNORMAL HIGH (ref 11–51)

## 2023-10-18 LAB — HEMOGLOBIN AND HEMATOCRIT, BLOOD
HCT: 24.4 % — ABNORMAL LOW (ref 39.0–52.0)
HCT: 21.9 % — ABNORMAL LOW (ref 39.0–52.0)
Hemoglobin: 7.8 g/dL — ABNORMAL LOW (ref 13.0–17.0)
Hemoglobin: 7 g/dL — ABNORMAL LOW (ref 13.0–17.0)

## 2023-10-18 LAB — BRAIN NATRIURETIC PEPTIDE: B Natriuretic Peptide: 890.2 pg/mL — ABNORMAL HIGH (ref 0.0–100.0)

## 2023-10-18 LAB — PHOSPHORUS
Phosphorus: 8.5 mg/dL — ABNORMAL HIGH (ref 2.5–4.6)
Phosphorus: 7.3 mg/dL — ABNORMAL HIGH (ref 2.5–4.6)

## 2023-10-18 LAB — BETA-HYDROXYBUTYRIC ACID: Beta-Hydroxybutyric Acid: 0.24 mmol/L (ref 0.05–0.27)

## 2023-10-18 LAB — AMMONIA: Ammonia: <13 umol/L (ref 9–35)

## 2023-10-18 LAB — TSH: TSH: 2.292 u[IU]/mL (ref 0.350–4.500)

## 2023-10-18 MED ORDER — SODIUM BICARBONATE 8.4 % IV SOLN
INTRAVENOUS | Status: AC
  Filled 2023-10-18: qty 100

## 2023-10-18 MED ORDER — VANCOMYCIN VARIABLE DOSE PER UNSTABLE RENAL FUNCTION (PHARMACIST DOSING): Status: DC

## 2023-10-18 MED ORDER — SODIUM CHLORIDE 0.9% IV SOLUTION
Freq: Once | INTRAVENOUS | Status: AC
Start: 2023-10-18 — End: 2023-10-19

## 2023-10-18 MED ORDER — FENTANYL BOLUS VIA INFUSION: 25.0000 ug | INTRAVENOUS | Status: DC | PRN

## 2023-10-18 MED ORDER — PERFLUTREN LIPID MICROSPHERE
1.0000 mL | INTRAVENOUS | Status: AC | PRN
  Administered 2023-10-18: 2 mL via INTRAVENOUS

## 2023-10-18 MED ORDER — LEVETIRACETAM (KEPPRA) 500 MG/5 ML ADULT IV PUSH
1000.0000 mg | Freq: Once | INTRAVENOUS | Status: AC
  Administered 2023-10-18: 1000 mg via INTRAVENOUS
  Filled 2023-10-18: qty 10

## 2023-10-18 MED ORDER — SODIUM CHLORIDE 0.9 % IV SOLN
100.0000 mg | Freq: Two times a day (BID) | INTRAVENOUS | Status: DC
  Administered 2023-10-18 – 2023-10-22 (×9): 100 mg via INTRAVENOUS
  Filled 2023-10-18 (×10): qty 100

## 2023-10-18 MED ORDER — VASOPRESSIN 20 UNITS/100 ML INFUSION FOR SHOCK
0.0000 [IU]/min | INTRAVENOUS | Status: DC
  Administered 2023-10-18: 0.03 [IU]/min via INTRAVENOUS
  Administered 2023-10-18: 0.02 [IU]/min via INTRAVENOUS
  Administered 2023-10-19: 0.03 [IU]/min via INTRAVENOUS
  Filled 2023-10-18 (×2): qty 100

## 2023-10-18 MED ORDER — MIDAZOLAM HCL 2 MG/2ML IJ SOLN
2.0000 mg | Freq: Once | INTRAMUSCULAR | Status: AC
  Administered 2023-10-18: 2 mg via INTRAVENOUS

## 2023-10-18 MED ORDER — STERILE WATER FOR INJECTION IV SOLN
INTRAVENOUS | Status: DC
  Filled 2023-10-18: qty 1000
  Filled 2023-10-18 (×2): qty 150
  Filled 2023-10-18: qty 1000
  Filled 2023-10-18: qty 150
  Filled 2023-10-18: qty 1000

## 2023-10-18 MED ORDER — PIPERACILLIN-TAZOBACTAM 3.375 G IVPB
3.3750 g | Freq: Three times a day (TID) | INTRAVENOUS | Status: DC
  Administered 2023-10-18 – 2023-10-19 (×3): 3.375 g via INTRAVENOUS
  Filled 2023-10-18 (×3): qty 50

## 2023-10-18 MED ORDER — LEVETIRACETAM (KEPPRA) 500 MG/5 ML ADULT IV PUSH
500.0000 mg | Freq: Two times a day (BID) | INTRAVENOUS | Status: DC
  Administered 2023-10-18 – 2023-10-19 (×3): 500 mg via INTRAVENOUS
  Filled 2023-10-18 (×4): qty 5

## 2023-10-18 MED ORDER — SODIUM CHLORIDE 0.9% IV SOLUTION: Freq: Once | INTRAVENOUS | Status: AC

## 2023-10-18 MED ORDER — METHYLPREDNISOLONE SODIUM SUCC 40 MG IJ SOLR: 40.0000 mg | Freq: Two times a day (BID) | INTRAMUSCULAR | Status: DC

## 2023-10-18 MED ORDER — FENTANYL 2500MCG IN NS 250ML (10MCG/ML) PREMIX INFUSION
0.0000 ug/h | INTRAVENOUS | Status: DC
  Administered 2023-10-18: 50 ug/h via INTRAVENOUS
  Filled 2023-10-18: qty 250

## 2023-10-18 MED ORDER — METHYLPREDNISOLONE SODIUM SUCC 125 MG IJ SOLR
125.0000 mg | Freq: Once | INTRAMUSCULAR | Status: AC
  Administered 2023-10-18: 125 mg via INTRAVENOUS
  Filled 2023-10-18: qty 2

## 2023-10-18 MED ORDER — POLYETHYLENE GLYCOL 3350 17 G PO PACK: 17.0000 g | PACK | Freq: Every day | ORAL | Status: DC | PRN

## 2023-10-18 MED ORDER — SODIUM CHLORIDE 0.9 % IV SOLN
1.0000 g | Freq: Two times a day (BID) | INTRAVENOUS | Status: DC
  Administered 2023-10-18: 1 g via INTRAVENOUS
  Filled 2023-10-18 (×2): qty 20

## 2023-10-18 MED ORDER — CHLORHEXIDINE GLUCONATE CLOTH 2 % EX PADS
6.0000 | MEDICATED_PAD | Freq: Every day | CUTANEOUS | Status: DC
  Administered 2023-10-18: 6 via TOPICAL

## 2023-10-18 MED ORDER — VECURONIUM BROMIDE 10 MG IV SOLR: 10.0000 mg | INTRAVENOUS | Status: DC | PRN

## 2023-10-18 MED ORDER — MIDAZOLAM HCL 2 MG/2ML IJ SOLN: 2.0000 mg | Freq: Once | INTRAMUSCULAR | Status: DC

## 2023-10-18 MED ORDER — VANCOMYCIN HCL 1750 MG/350ML IV SOLN
1750.0000 mg | Freq: Once | INTRAVENOUS | Status: DC
Start: 2023-10-18 — End: 2023-10-18
  Filled 2023-10-18: qty 350

## 2023-10-18 MED ORDER — SODIUM BICARBONATE 8.4 % IV SOLN
100.0000 meq | Freq: Once | INTRAVENOUS | Status: AC
  Administered 2023-10-18: 100 meq via INTRAVENOUS

## 2023-10-18 MED ORDER — PROPOFOL 1000 MG/100ML IV EMUL
0.0000 ug/kg/min | INTRAVENOUS | Status: DC
  Administered 2023-10-18: 50 ug/kg/min via INTRAVENOUS
  Administered 2023-10-18: 20 ug/kg/min via INTRAVENOUS
  Administered 2023-10-18 – 2023-10-19 (×3): 50 ug/kg/min via INTRAVENOUS
  Filled 2023-10-18 (×5): qty 100

## 2023-10-18 MED ORDER — NOREPINEPHRINE 16 MG/250ML-% IV SOLN
0.0000 ug/min | INTRAVENOUS | Status: DC
  Administered 2023-10-18: 14 ug/min via INTRAVENOUS
  Filled 2023-10-18 (×2): qty 250

## 2023-10-18 MED ORDER — SODIUM CHLORIDE 0.9 % IV SOLN
3.0000 g | Freq: Two times a day (BID) | INTRAVENOUS | Status: DC
  Administered 2023-10-18: 3 g via INTRAVENOUS
  Filled 2023-10-18: qty 8

## 2023-10-18 MED ORDER — HYDROCORTISONE SOD SUC (PF) 100 MG IJ SOLR
50.0000 mg | Freq: Four times a day (QID) | INTRAMUSCULAR | Status: DC
  Administered 2023-10-18 – 2023-10-19 (×4): 50 mg via INTRAVENOUS
  Filled 2023-10-18 (×4): qty 2

## 2023-10-18 NOTE — Progress Notes (Signed)
 Responded to code blue, son present while staff administered medical help to patient. Stood with son in corner of room and provided presence and encouragement. Once stable, patient transported to ICU and chaplain escorted son to waiting area. Stayed with son until sibling arrived then remained until NP shared status of patient. Beautiful time of sharing fond memories and recalling past with father.  Kazuto Sevey, MTE Chaplain Intern

## 2023-10-18 NOTE — Progress Notes (Signed)
 Eeg done

## 2023-10-18 NOTE — Procedures (Addendum)
 I was immediately available for procedure    Dishon Kehoe, M.D.  Pulmonary & Critical Care Medicine  [facility]     Arterial Catheter Insertion Procedure Note  Scott Hudson  [id number]  10/01/1929  Date:10/18/23  Time:4:27 PM    Provider Performing: Inge JONETTA Lecher    Procedure: Insertion of Arterial Line (63379) with US  guidance (23062)   Indication(s) Blood pressure monitoring and/or need for frequent ABGs  Consent Risks of the procedure as well as the alternatives and risks of each were explained to the patient and/or caregiver.  Consent for the procedure was obtained and is signed in the bedside chart  Anesthesia None   Time Out Verified patient identification, verified procedure, site/side was marked, verified correct patient position, special equipment/implants available, medications/allergies/relevant history reviewed, required imaging and test results available.   Sterile Technique Maximal sterile technique including full sterile barrier drape, hand hygiene, sterile gown, sterile gloves, mask, hair covering, sterile ultrasound probe cover (if used).   Procedure Description Area of catheter insertion was cleaned with chlorhexidine and draped in sterile fashion. With real-time ultrasound guidance an arterial catheter was placed into the right radial artery.  Appropriate arterial tracings confirmed on monitor.     Complications/Tolerance None; patient tolerated the procedure well.   EBL Minimal   Specimen(s) None    Inge Lecher, AGACNP-BC [facility] Prefer epic messenger for cross cover needs If after hours, please call E-link

## 2023-10-18 NOTE — TOC Initial Note (Signed)
 Transition of Care [facility]) - Initial/Assessment Note    Patient Details  Name: Scott Hudson MRN: [id number] Date of Birth: 26-Sep-1929  Transition of Care [facility]) CM/SW Contact:    [place] L Kristol Almanzar, LCSW Phone Number: 10/18/2023, 12:39 PM  Clinical Narrative:                  Chart review. Patient received a Foley. He also received three units of blood. TOC will continue to follow patient through discharge.        Patient Goals and CMS Choice            Expected Discharge Plan and Services                                              Prior Living Arrangements/Services                       Activities of Daily Living      Permission Sought/Granted                  Emotional Assessment              Admission diagnosis:  Shortness of breath [R06.02] Acute respiratory failure with hypoxia (HCC) [J96.01] Symptomatic anemia [D64.9] Gastrointestinal hemorrhage, unspecified gastrointestinal hemorrhage type [K92.2] Anemia, unspecified type [D64.9] Patient Active Problem List   Diagnosis Date Noted   Cardiac arrest (HCC) 10/18/2023   Symptomatic anemia 10/17/2023   AKI (acute kidney injury) 10/17/2023   Pain due to onychomycosis of toenails of both feet 03/17/2020   Clavi 03/17/2020   Chest pain 04/28/2018   History of peptic ulcer 12/19/2017   Essential hypertension 12/02/2017   GERD (gastroesophageal reflux disease) 11/04/2016   Diabetes mellitus type 2 in nonobese (HCC) 05/02/2016   Hyperlipidemia 05/02/2016   PAD (peripheral artery disease) 05/02/2016   Absolute anemia    Benign neoplasm of descending colon    Iron  deficiency anemia due to chronic blood loss 06/17/2015   PCP:  Corlis Honor BROCKS, MD Pharmacy:   [phone] - [place], [place] - [address] [place] [address] Phone: [phone] Fax: [phone]     Social Drivers of Health (SDOH) Social History: SDOH Screenings   Food Insecurity: Patient  Unable To Answer (10/18/2023)  Housing: Patient Unable To Answer (10/18/2023)  Transportation Needs: Patient Unable To Answer (10/18/2023)  Utilities: Patient Unable To Answer (10/18/2023)  Financial Resource Strain: Medium Risk (05/08/2023)   Received from [facility]  Social Connections: Patient Unable To Answer (10/18/2023)  Tobacco Use: Medium Risk (10/17/2023)   SDOH Interventions: Food Insecurity Interventions: Patient Unable to Answer Housing Interventions: Patient Unable to Answer Transportation Interventions: Patient Unable to Answer Utilities Interventions: Patient Unable to Answer Social Connections Interventions: Patient Unable to Answer   Readmission Risk Interventions     No data to display

## 2023-10-18 NOTE — Progress Notes (Signed)
 Transported to CT and back on transport vent.

## 2023-10-18 NOTE — Consult Note (Signed)
 GI Inpatient Consult Note  Reason for Consult: Symptomatic anemia    Attending Requesting Consult: Dr. Emery Fuss  History of Present Illness: Scott Hudson is a 89 y.o. male seen for evaluation of symptomatic anemia at the request of admitting hospitalist - Dr. Emery Fuss. Patient has a PMH of HTN, HLD, PVD, thrombocytopenia, GERD, T2DM, and hx of PUD. He presented to the [facility] ED 10/9 via EMS for chief complaint of 2-day history of chest discomfort and shortness of breath. Upon presentation to the ED, all vital signs were sable. Labs were significant anemia with hemoglobin 4.8, elevated lipase, elevated BNP, and elevated troponin. Imaging showed pulmonary edema, pleural effusion, and possible pneumonia. Per ED physician, he had dark brown stool which was guaiac positive. No frank hematochezia or melanotic stool. He was given 40 mg of Lasix, 2 units pRBCs, Protonix , and started on antibiotics. CODE BLUE was called and patient sustained PEA cardiac arrest of unknown etiology. ACLS protocol initiated and ROSC achieved after 12 minutes. He remains intubated and sedated. GI consulted for further evaluation and management.   Patient seen and examined this afternoon. He is intubated and sedated. All history is obtained through chart review and nursing team. No family bedside. Per nursing report, there have been no signs of overt GIB. He has not had a bowel movement since being in the ICU. He has received a total of 3 units pRBCs. Hemoglobin 7.8 this afternoon.   Summary of GI Procedures:  CSY 11/08/2015 Renne) - one subcentimeter polyp removed from descending colon, sigmoid diverticulosis, Grade II IH   Past Medical History:  Past Medical History:  Diagnosis Date   Anemia    Diabetes mellitus without complication (HCC)    GERD (gastroesophageal reflux disease)    Hypercholesteremia    Hypertension    Peripheral vascular disease    Thrombocytopenia     Problem List: Patient  Active Problem List   Diagnosis Date Noted   Cardiac arrest (HCC) 10/18/2023   Symptomatic anemia 10/17/2023   AKI (acute kidney injury) 10/17/2023   Pain due to onychomycosis of toenails of both feet 03/17/2020   Clavi 03/17/2020   Chest pain 04/28/2018   History of peptic ulcer 12/19/2017   Essential hypertension 12/02/2017   GERD (gastroesophageal reflux disease) 11/04/2016   Diabetes mellitus type 2 in nonobese (HCC) 05/02/2016   Hyperlipidemia 05/02/2016   PAD (peripheral artery disease) 05/02/2016   Absolute anemia    Benign neoplasm of descending colon    Iron  deficiency anemia due to chronic blood loss 06/17/2015    Past Surgical History: Past Surgical History:  Procedure Laterality Date   COLONOSCOPY     COLONOSCOPY WITH PROPOFOL  N/A 11/08/2015   Procedure: COLONOSCOPY WITH PROPOFOL ;  Surgeon: Rogelia Copping, MD;  Location: [facility] ENDOSCOPY;  Service: Endoscopy;  Laterality: N/A;   LEFT HEART CATH AND CORONARY ANGIOGRAPHY Left 12/03/2017   Procedure: LEFT HEART CATH AND CORONARY ANGIOGRAPHY;  Surgeon: Bosie Vinie LABOR, MD;  Location: [facility] INVASIVE CV LAB;  Service: Cardiovascular;  Laterality: Left;   PERIPHERAL VASCULAR CATHETERIZATION  11/30/2014   Procedure: Lower Extremity Intervention;  Surgeon: Cordella KANDICE Shawl, MD;  Location: [facility] INVASIVE CV LAB;  Service: Cardiovascular;;   PERIPHERAL VASCULAR CATHETERIZATION N/A 11/30/2014   Procedure: Abdominal Aortogram w/Lower Extremity;  Surgeon: Cordella KANDICE Shawl, MD;  Location: [facility] INVASIVE CV LAB;  Service: Cardiovascular;  Laterality: N/A;   rt hand trama, tips of two fingers removed      Allergies: No Known  Allergies  Home Medications: Medications Prior to Admission  Medication Sig Dispense Refill Last Dose/Taking   amLODipine (NORVASC) 5 MG tablet Take 5 mg by mouth daily.   10/17/2023 Morning   atorvastatin  (LIPITOR) 10 MG tablet Take 10 mg by mouth every evening.   10/17/2023   hydrALAZINE  (APRESOLINE ) 10 MG tablet Take  10 mg by mouth daily.   5 10/17/2023   isosorbide  mononitrate (IMDUR ) 30 MG 24 hr tablet Take 30 mg by mouth daily.  5 10/17/2023   pantoprazole  (PROTONIX ) 40 MG tablet Take 1 tablet (40 mg total) by mouth daily. 30 tablet 1 10/17/2023   tamsulosin  (FLOMAX ) 0.4 MG CAPS capsule Take 0.4 mg by mouth every evening.  5 10/17/2023   atorvastatin  (LIPITOR) 20 MG tablet Take 20 mg by mouth every evening. (Patient not taking: Reported on 10/18/2023)  4 Not Taking   ferrous fumarate -iron  polysaccharide complex (TANDEM) 162-115.2 MG CAPS capsule Take 1 capsule by mouth daily with breakfast. 30 capsule 6 Unknown   omeprazole (PRILOSEC) 40 MG capsule Take 40 mg by mouth daily. Reported on 06/16/2015 (Patient not taking: Reported on 10/18/2023)   Not Taking   traMADol  (ULTRAM ) 50 MG tablet Take 0.5 tablets (25 mg total) by mouth every 6 (six) hours as needed. (Patient not taking: Reported on 10/18/2023) 10 tablet 0 Not Taking   Home medication reconciliation was completed with the patient.   Scheduled Inpatient Medications:    sodium chloride    Intravenous Once   Chlorhexidine Gluconate Cloth  6 each Topical Daily   hydrocortisone sod succinate (SOLU-CORTEF) inj  50 mg Intravenous Q6H   insulin aspart  0-15 Units Subcutaneous Q4H   levETIRAcetam  500 mg Intravenous Q12H   midazolam   2 mg Intravenous Once   mouth rinse  15 mL Mouth Rinse Q2H   pantoprazole  (PROTONIX ) IV  40 mg Intravenous Q12H    Continuous Inpatient Infusions:    sodium chloride  Stopped (10/18/23 9371)   doxycycline (VIBRAMYCIN) IV Stopped (10/18/23 9161)   fentaNYL  infusion INTRAVENOUS 50 mcg/hr (10/18/23 1200)   norepinephrine (LEVOPHED) Adult infusion 6 mcg/min (10/18/23 1200)   piperacillin-tazobactam (ZOSYN)  IV     propofol  (DIPRIVAN ) infusion 20 mcg/kg/min (10/18/23 1535)   sodium bicarbonate 150 mEq in sterile water 1,150 mL infusion 75 mL/hr at 10/18/23 1200   vasopressin 0.02 Units/min (10/18/23 1458)    PRN Inpatient  Medications:  fentaNYL , midazolam , ondansetron  **OR** ondansetron  (ZOFRAN ) IV, mouth rinse, polyethylene glycol  Family History: family history includes Dementia in his brother.  The patient's family history is negative for inflammatory bowel disorders, GI malignancy, or solid organ transplantation.  Social History:   reports that he quit smoking about 9 years ago. His smoking use included cigarettes. He started smoking about 49 years ago. He has a 60 pack-year smoking history. He has never used smokeless tobacco. He reports that he does not drink alcohol and does not use drugs. The patient denies ETOH, tobacco, or drug use.   Review of Systems:  Unable to be obtained 2/2 patient being intubated and sedated   Physical Examination: BP 134/72   Pulse 78   Temp 99.5 F (37.5 C)   Resp 19   Ht 5' 8 (1.727 m)   Wt 73.9 kg   SpO2 96%   BMI 24.78 kg/m  Gen: intubated and sedated HEENT: PEERLA, EOMI, Neck: supple, no JVD or thyromegaly Chest: Normal respiratory effort CV: RRR, no m/g/c/r Abd: soft, NT, ND, +BS in all four quadrants; no HSM, guarding,  ridigity, or rebound tenderness Ext: no edema, well perfused with 2+ pulses, Skin: no rash or lesions noted Lymph: no LAD  Data: Lab Results  Component Value Date   WBC 10.7 (H) 10/18/2023   HGB 7.8 (L) 10/18/2023   HCT 24.4 (L) 10/18/2023   MCV 86.9 10/18/2023   PLT 204 10/18/2023   Recent Labs  Lab 10/17/23 1639 10/18/23 0029 10/18/23 1206  HGB 4.8* 6.0* 7.8*   Lab Results  Component Value Date   NA 144 10/18/2023   K 4.7 10/18/2023   CL 105 10/18/2023   CO2 24 10/18/2023   BUN 35 (H) 10/18/2023   CREATININE 2.40 (H) 10/18/2023   Lab Results  Component Value Date   ALT 116 (H) 10/18/2023   AST 146 (H) 10/18/2023   ALKPHOS 60 10/18/2023   BILITOT 0.7 10/18/2023   Recent Labs  Lab 10/18/23 0029  INR 1.4*    Assessment/Plan:  89 y/o AA male with a PMH of HTN, HLD, PVD, thrombocytopenia, GERD, T2DM, and hx  of PUD presented to the [facility] ED 10/9 via EMS for chief complaint of 2-day history of chest discomfort and shortness of breath. Initial labs showed severe anemia with hemoglobin 4.8. He subsequently developed PEA arrest requiring intubation and CPR. GI consulted for concerns of GI bleed. No overt signs of GI bleeding.   Severe anemia - initial hemoglobin 4.8. He is s/p 3 units pRBCs with repeat hemoglobin this afternoon 7.8. No signs of overt gastrointestinal bleeding.   Cardiac arrest - initial rhythm PEA  ARDS  Acute hypoxic respiratory failure in setting of acute encephalopathy and cardiac arrest  Hx of peptic ulcer disease  Advanced Age   Recommendations:  - Maintain 2 large bore IVs for access - Continue to monitor serial H&H. Transfuse for Hgb <7.0.  - Continue Protonix  IV BID for gastric protection - No signs of overt GI bleeding at this time - Continue to monitor for signs of bleeding - Defer any luminal evaluation at this time in setting of no overt GI bleeding and stable H&H - Continue supportive care per primary team - GI will sign off at this time. Please call Dr. Aundria for any signs of bleeding or significant hemodynamic changes.  - Dr. Aundria will be available this weekend  Thank you for the consult. Please call with questions or concerns.  Twyla CHRISTELLA Primmer, PA-C [facility] [phone]

## 2023-10-18 NOTE — Progress Notes (Signed)
 Suspected ARDS Severe hypoxia, P/F ratio 61, difficulty oxygenating the patient on 100% and PEEP of 15. Discussed case with E-link Intensivist.  - switched patient to ARPV mode with success, SpO2 > 90% - broadened abx to doxycycline, Meropenem & vancomycin - rule out COVID-19 & viral panel - Initiate solu medrol, 125 mg ordered followed by 40 mg BID - f/u VBG to monitor PaCO2 - fentanyl  drip started to assist in compliance - family called and advised of clinical change. Discussed grave condition and concern for further deterioration. Son Scott Hudson is on his way to the hospital.   Jenita Ruth Rust-Chester, AGACNP-BC Acute Care Nurse Practitioner [facility]   [phone] / [phone] Please see Amion for details.

## 2023-10-18 NOTE — Consult Note (Addendum)
 Urology Consult  I have been asked to see the patient by Jenita Meek, NP, for evaluation and management of urinary retention/difficult Foley placement.  Chief Complaint: SOB  History of Present Illness: Scott Hudson is a 89 y.o. year old male with PMH diabetes, thrombocytopenia, and PVD admitted overnight with volume overload and symptomatic anemia.  He suffered a PEA arrest overnight and was transferred to the ICU, where he remains intubated with suspected ARDS.  Foley catheter placement has been attempted, but he has failed multiple attempts by nursing with both straight and coud tip catheters.  Urology has been consulted for difficult Foley placement.  A.m. bladder scan 350 mL.  External urinary management device in place with no urine in drainage tubing.  Anti-infectives (From admission, onward)    Start     Dose/Rate Route Frequency Ordered Stop   10/18/23 0730  meropenem (MERREM) 1 g in sodium chloride  0.9 % 100 mL IVPB        1 g 200 mL/hr over 30 Minutes Intravenous Every 12 hours 10/18/23 0636     10/18/23 0730  vancomycin (VANCOREADY) IVPB 1750 mg/350 mL        1,750 mg 175 mL/hr over 120 Minutes Intravenous  Once 10/18/23 0639     10/18/23 0644  vancomycin variable dose per unstable renal function (pharmacist dosing)         Does not apply See admin instructions 10/18/23 0644     10/18/23 0630  doxycycline (VIBRAMYCIN) 100 mg in sodium chloride  0.9 % 250 mL IVPB        100 mg 125 mL/hr over 120 Minutes Intravenous Every 12 hours 10/18/23 0538     10/18/23 0400  Ampicillin-Sulbactam (UNASYN) 3 g in sodium chloride  0.9 % 100 mL IVPB  Status:  Discontinued        3 g 200 mL/hr over 30 Minutes Intravenous Every 12 hours 10/18/23 0305 10/18/23 0637   10/17/23 1815  cefTRIAXone (ROCEPHIN) 1 g in sodium chloride  0.9 % 100 mL IVPB        1 g 200 mL/hr over 30 Minutes Intravenous  Once 10/17/23 1813 10/17/23 1903   10/17/23 1815  azithromycin (ZITHROMAX) 500 mg in  sodium chloride  0.9 % 250 mL IVPB        500 mg 250 mL/hr over 60 Minutes Intravenous  Once 10/17/23 1813 10/17/23 1940        Past Medical History:  Diagnosis Date   Anemia    Diabetes mellitus without complication (HCC)    GERD (gastroesophageal reflux disease)    Hypercholesteremia    Hypertension    Peripheral vascular disease    Thrombocytopenia     Past Surgical History:  Procedure Laterality Date   COLONOSCOPY     COLONOSCOPY WITH PROPOFOL  N/A 11/08/2015   Procedure: COLONOSCOPY WITH PROPOFOL ;  Surgeon: Rogelia Copping, MD;  Location: [facility] ENDOSCOPY;  Service: Endoscopy;  Laterality: N/A;   LEFT HEART CATH AND CORONARY ANGIOGRAPHY Left 12/03/2017   Procedure: LEFT HEART CATH AND CORONARY ANGIOGRAPHY;  Surgeon: Bosie Vinie LABOR, MD;  Location: [facility] INVASIVE CV LAB;  Service: Cardiovascular;  Laterality: Left;   PERIPHERAL VASCULAR CATHETERIZATION  11/30/2014   Procedure: Lower Extremity Intervention;  Surgeon: Cordella KANDICE Shawl, MD;  Location: [facility] INVASIVE CV LAB;  Service: Cardiovascular;;   PERIPHERAL VASCULAR CATHETERIZATION N/A 11/30/2014   Procedure: Abdominal Aortogram w/Lower Extremity;  Surgeon: Cordella KANDICE Shawl, MD;  Location: [facility] INVASIVE CV LAB;  Service: Cardiovascular;  Laterality: N/A;  rt hand trama, tips of two fingers removed      Home Medications:  No outpatient medications have been marked as taking for the 10/17/23 encounter [facility] Encounter).    Allergies: No Known Allergies  Family History  Problem Relation Age of Onset   Dementia Brother     Social History:  reports that he quit smoking about 9 years ago. His smoking use included cigarettes. He started smoking about 49 years ago. He has a 60 pack-year smoking history. He has never used smokeless tobacco. He reports that he does not drink alcohol and does not use drugs.  ROS: A complete review of systems was performed.  All systems are negative except for pertinent findings as  noted.  Physical Exam:  Vital signs in last 24 hours: Temp:  [91.2 F (32.9 C)-99.3 F (37.4 C)] 99.3 F (37.4 C) (10/10 1000) Pulse Rate:  [33-117] 76 (10/10 1000) Resp:  [14-24] 16 (10/10 1000) BP: (82-148)/(41-70) 134/58 (10/10 1000) SpO2:  [83 %-100 %] 97 % (10/10 1000) FiO2 (%):  [100 %] 100 % (10/10 0733) Weight:  [73.9 kg] 73.9 kg (10/09 1638) Constitutional: Intubated and sedated HEENT: [place] AT, moist mucus membranes Cardiovascular: No clubbing, cyanosis, or edema Respiratory: Normal respiratory effort GU: Pink discharge from urethral meatus following failed catheterization attempts Skin: No rashes, bruises or suspicious lesions  Laboratory Data:  Recent Labs    10/17/23 1639 10/18/23 0029  WBC 4.7 10.7*  HGB 4.8* 6.0*  HCT 16.5* 21.2*   Recent Labs    10/17/23 1639 10/18/23 0029 10/18/23 0431  NA 139 140 142  K 4.2 4.5 4.6  CL 109 109 107  CO2 17* 14* 18*  GLUCOSE 140* 241* 208*  BUN 27* 30* 33*  CREATININE 1.83* 2.31* 2.21*  CALCIUM  9.0 8.4* 8.5*   Recent Labs    10/18/23 0029  INR 1.4*   Urinalysis    Component Value Date/Time   COLORURINE YELLOW (A) 06/16/2015 1120   APPEARANCEUR HAZY (A) 06/16/2015 1120   LABSPEC 1.013 06/16/2015 1120   PHURINE 5.0 06/16/2015 1120   GLUCOSEU NEGATIVE 06/16/2015 1120   HGBUR NEGATIVE 06/16/2015 1120   BILIRUBINUR NEGATIVE 06/16/2015 1120   KETONESUR NEGATIVE 06/16/2015 1120   PROTEINUR NEGATIVE 06/16/2015 1120   NITRITE NEGATIVE 06/16/2015 1120   LEUKOCYTESUR 3+ (A) 06/16/2015 1120   Results for orders placed or performed during the hospital encounter of 10/17/23  MRSA Next Gen by PCR, Nasal     Status: None   Collection Time: 10/18/23  4:40 AM   Specimen: Nasal Mucosa; Nasal Swab  Result Value Ref Range Status   MRSA by PCR Next Gen NOT DETECTED NOT DETECTED Final    Comment: (NOTE) The GeneXpert MRSA Assay (FDA approved for NASAL specimens only), is one component of a comprehensive MRSA colonization  surveillance program. It is not intended to diagnose MRSA infection nor to guide or monitor treatment for MRSA infections. Test performance is not FDA approved in patients less than 7 years old. Performed at [facility], [address] [place], [place] [address]   Resp panel by RT-PCR (RSV, Flu A&B, Covid) Anterior Nasal Swab     Status: None   Collection Time: 10/18/23  5:27 AM   Specimen: Anterior Nasal Swab  Result Value Ref Range Status   SARS Coronavirus 2 by RT PCR NEGATIVE NEGATIVE Final    Comment: (NOTE) SARS-CoV-2 target nucleic acids are NOT DETECTED.  The SARS-CoV-2 RNA is generally detectable in upper respiratory specimens  during the acute phase of infection. The lowest concentration of SARS-CoV-2 viral copies this assay can detect is 138 copies/mL. A negative result does not preclude SARS-Cov-2 infection and should not be used as the sole basis for treatment or other patient management decisions. A negative result may occur with  improper specimen collection/handling, submission of specimen other than nasopharyngeal swab, presence of viral mutation(s) within the areas targeted by this assay, and inadequate number of viral copies(<138 copies/mL). A negative result must be combined with clinical observations, patient history, and epidemiological information. The expected result is Negative.  Fact Sheet for Patients:  BloggerCourse.com  Fact Sheet for Healthcare Providers:  SeriousBroker.it  This test is no t yet approved or cleared by the [place]  FDA and  has been authorized for detection and/or diagnosis of SARS-CoV-2 by FDA under an Emergency Use Authorization (EUA). This EUA will remain  in effect (meaning this test can be used) for the duration of the COVID-19 declaration under Section 564(b)(1) of the Act, 21 U.S.C.section 360bbb-3(b)(1), unless the authorization is terminated  or revoked  sooner.       Influenza A by PCR NEGATIVE NEGATIVE Final   Influenza B by PCR NEGATIVE NEGATIVE Final    Comment: (NOTE) The Xpert Xpress SARS-CoV-2/FLU/RSV plus assay is intended as an aid in the diagnosis of influenza from Nasopharyngeal swab specimens and should not be used as a sole basis for treatment. Nasal washings and aspirates are unacceptable for Xpert Xpress SARS-CoV-2/FLU/RSV testing.  Fact Sheet for Patients: BloggerCourse.com  Fact Sheet for Healthcare Providers: SeriousBroker.it  This test is not yet approved or cleared by the [place]  FDA and has been authorized for detection and/or diagnosis of SARS-CoV-2 by FDA under an Emergency Use Authorization (EUA). This EUA will remain in effect (meaning this test can be used) for the duration of the COVID-19 declaration under Section 564(b)(1) of the Act, 21 U.S.C. section 360bbb-3(b)(1), unless the authorization is terminated or revoked.     Resp Syncytial Virus by PCR NEGATIVE NEGATIVE Final    Comment: (NOTE) Fact Sheet for Patients: BloggerCourse.com  Fact Sheet for Healthcare Providers: SeriousBroker.it  This test is not yet approved or cleared by the [place]  FDA and has been authorized for detection and/or diagnosis of SARS-CoV-2 by FDA under an Emergency Use Authorization (EUA). This EUA will remain in effect (meaning this test can be used) for the duration of the COVID-19 declaration under Section 564(b)(1) of the Act, 21 U.S.C. section 360bbb-3(b)(1), unless the authorization is terminated or revoked.  Performed at [facility], [address], [place], [place] [address]     Radiologic Imaging: [facility] Chest [place] 1 View Result Date: 10/18/2023 EXAM: 1 VIEW(S) XRAY OF THE CHEST 10/18/2023 05:54:17 AM COMPARISON: Portable chest x ray yesterday and earlier. CLINICAL HISTORY: 89 year old  male. Hypoxia. FINDINGS: LINES, TUBES AND DEVICES: Endotracheal tube tip appears stable just above the carina stable visible in territory. Enteric tube now courses smoothly from the neck through the mediastinum and into the upper abdomen with tip not included. LUNGS AND PLEURA: Emphysema demonstrated on 2020 chest CT. Widespread coarse bilateral pulmonary interstitial opacity not significantly changed. Lung volumes and ventilation not significantly changed. Small bilateral pleural effusions are possible. No pneumothorax. No air bronchograms. HEART AND MEDIASTINUM: Mediastinal contours remain within normal limits. BONES AND SOFT TISSUES: No acute osseous abnormality. Paucity of bowel gas in the visible abdomen. IMPRESSION: 1. Chronic emphysema with Widespread coarse bilateral interstitial pulmonary opacities, not significantly changed. Consider  pulmonary edema, bilateral pneumonia, ARDS. 2. ETT tip near the carina. Consider retraction of 1 cm for optimal placement. Enteric tube now satisfactory courses to the upper abdomen with tip not included. Electronically signed by: Helayne Hurst MD 10/18/2023 06:21 AM EDT RP Workstation: [id number]   CT HEAD WO CONTRAST ( ) Result Date: 10/18/2023 EXAM: CT HEAD WITHOUT CONTRAST 10/18/2023 03:35:20 AM TECHNIQUE: CT of the head was performed without the administration of intravenous contrast. Automated exposure control, iterative reconstruction, and/or weight based adjustment of the mA/kV was utilized to reduce the radiation dose to as low as reasonably achievable. COMPARISON: CT head 04/29/2013 CLINICAL HISTORY: Mental status change, unknown cause. FINDINGS: BRAIN AND VENTRICLES: No acute hemorrhage. No evidence of acute infarct. No hydrocephalus. No extra-axial collection. No mass effect or midline shift. Patchy white matter hypodensities, compatible with chronic small vessel ischemic change. ORBITS: No acute abnormality. SINUSES: No acute abnormality. SOFT TISSUES AND  SKULL: No acute soft tissue abnormality. No skull fracture. IMPRESSION: 1. No acute intracranial abnormality. 2. Chronic microvascular ischemic change. Electronically signed by: Gilmore Molt MD 10/18/2023 03:41 AM EDT RP Workstation: [id number]   DG Chest Port 1 View Result Date: 10/17/2023 CLINICAL DATA:  Check endotracheal tube placement EXAM: PORTABLE CHEST 1 VIEW COMPARISON:  Film from earlier in the same day. FINDINGS: Tracheal tube is noted 2 cm above the carina. Nasogastric catheter is coiled within the pharynx with the tip at the thoracic inlet. This should withdrawn completely readvanced. Lungs are well aerated bilaterally. Increasing vascular congestion and interstitial edema is noted. The cardiac shadow is stable. No acute bony abnormality is noted. IMPRESSION: Endotracheal tube as described. Gastric catheter appears coiled within the pharynx. This should be withdrawn and readvanced. Worsening CHF. Electronically Signed   By: Oneil Devonshire M.D.   On: 10/17/2023 23:56   DG Abd 1 View Result Date: 10/17/2023 CLINICAL DATA:  Check gastric catheter placement EXAM: ABDOMEN - 1 VIEW COMPARISON:  None Available. FINDINGS: Gastric catheter has been withdrawn and readvanced. Tip lies within the stomach although the proximal side port lies in the distal esophagus. This could be advanced further into the stomach. No free air is seen. IMPRESSION: Gastric catheter as described. This should be advanced deeper into the stomach. Electronically Signed   By: Oneil Devonshire M.D.   On: 10/17/2023 23:55   CT ABDOMEN PELVIS W CONTRAST Result Date: 10/17/2023 CLINICAL DATA:  Acute abdominal pain and shortness of breath, initial encounter EXAM: CT ABDOMEN AND PELVIS WITH CONTRAST TECHNIQUE: Multidetector CT imaging of the abdomen and pelvis was performed using the standard protocol following bolus administration of intravenous contrast. RADIATION DOSE REDUCTION: This exam was performed according to the departmental  dose-optimization program which includes automated exposure control, adjustment of the mA and/or kV according to patient size and/or use of iterative reconstruction technique. CONTRAST:  80mL OMNIPAQUE  IOHEXOL  300 MG/ML  SOLN COMPARISON:  02/16/2018 FINDINGS: Lower chest: Lung bases demonstrate small pleural effusions with minimal basilar atelectasis. No focal confluent infiltrate is seen. Calcified nodes are noted consistent with prior granulomatous disease. Hepatobiliary: No focal liver abnormality is seen. No gallstones, gallbladder wall thickening, or biliary dilatation. Pancreas: Unremarkable. No pancreatic ductal dilatation or surrounding inflammatory changes. Spleen: Scattered calcified granulomas are noted. Adrenals/Urinary Tract: Adrenal glands are within normal limits. Kidneys demonstrate a normal enhancement pattern bilaterally. No obstructive changes are seen. The bladder is well distended. Stomach/Bowel: No obstructive or inflammatory changes of the colon are seen. The appendix is within normal limits. Small bowel and stomach are  unremarkable. Vascular/Lymphatic: Atherosclerotic calcifications of the abdominal aorta are noted. Scattered mural thrombus is seen. This is most noted at the aortic hiatus however stable from prior exam. Reproductive: Prostate is unremarkable. Other: No abdominal wall hernia or abnormality. No abdominopelvic ascites. Musculoskeletal: Degenerative changes of lumbar spine are noted. IMPRESSION: No acute abnormality is noted in the abdomen correspond with the given clinical history. Mild basilar atelectasis and small effusions. Changes of prior granulomatous disease. Electronically Signed   By: Oneil Devonshire M.D.   On: 10/17/2023 20:10   DG Chest 2 View Result Date: 10/17/2023 CLINICAL DATA:  Chest pain and shortness of breath EXAM: CHEST - 2 VIEW COMPARISON:  01/20/2019 FINDINGS: Shallow inspiration with elevation of the left hemidiaphragm. Mild cardiac enlargement with mild  pulmonary vascular congestion. Peribronchial thickening with bronchiectasis consistent with chronic bronchitic changes. Superimposed infiltration in the left lower lung with small left pleural effusion. This likely represents pneumonia. No pneumothorax. Calcification of the aorta. Degenerative changes in the spine and shoulders. IMPRESSION: 1. Cardiac enlargement with mild pulmonary vascular congestion. 2. Chronic bronchitic changes in the lungs. 3. Increasing infiltration in the left lung base likely representing pneumonia. Likely small left pleural effusion. Electronically Signed   By: Elsie Gravely M.D.   On: 10/17/2023 17:43   Assessment & Plan:  89 y.o. male who initially presented with symptomatic anemia and volume overload causing dyspnea and subsequently developed PEA arrest.  He is now in the ICU, intubated and sedated.  He has failed several attempts at Foley placement by nursing and urology was consulted to assist.  Using sterile technique, I prepped the patient with Betadine and instilled 2 tubes of 2% lidocaine  jelly per urethra.  I initially attempted placement of an 12 French coud catheter, but met significant resistance at the penile urethra.  I subsequently attempted to place a 14 [place] coud catheter, but again met significant resistance at the penile urethra consistent with urethral stricture.  I subsequently introduced a sensor wire per the meatus and inserted it what I felt was the level of the bladder.  I threaded a 5 French ureteral catheter over the sensor wire and removed the wire.  There was urine return from the catheter consistent with appropriate placement within the bladder lumen.  I subsequently reinserted the sensor wire through the ureteral catheter and removed the ureteral catheter.  I then serially dilated the patient's urethra from 8 [place] to 18 [place].  There was significant resistance with this starting at 10 [place] size.  His stricture felt multifocal, with the  most resistance being felt at the distal penile and prostatic urethra.  Ultimately, I was able to thread a 16 [place] tip Foley catheter over the sensor wire and inserted to the level of the bladder.  The catheter was hubbed with immediate drainage of clear, yellow urine.  The balloon was inflated with 10 cc of sterile water and the catheter was attached to a night bag for drainage.  Recommendations: - Continue Foley catheter - Outpatient voiding trial in 2 weeks - Agree with antibiotics given degree of manipulation at the bedside today  Thank you for involving me in this patient's care, please page with any further questions or concerns.  Sani Loiseau, PA-C 10/18/2023 10:59 AM

## 2023-10-18 NOTE — Progress Notes (Signed)
 Pharmacy Antibiotic Note  Scott Hudson is a 89 y.o. male admitted on 10/17/2023 with pneumonia.  Pharmacy has been consulted for meropenem, vancomycin dosing.   SrCr increased ~ 0.4 mg/dL in less than 24 hrs so technically is AKI/unstable renal function.   Will dose Vanc by levels until renal function is stable.   Plan: Meropenem 1 gm IV Q12H ordered to start on 10/10 @ 0700.   Vancomycin 1750 mg IV X 1 loading dose ordered for 10/10 @ ~ 0700.  - Will dose by levels until renal function stable - Goal trough:  15 - 20 mcg/mL - Will draw random vanc ~ 24 hrs after loading dose on 10/11 @ 0700.   Height: 5' 8 (172.7 cm) Weight: 73.9 kg (163 lb) IBW/kg (Calculated) : 68.4  Temp (24hrs), Avg:92.8 F (33.8 C), Min:91.2 F (32.9 C), Max:98.8 F (37.1 C)  Recent Labs  Lab 10/17/23 1639 10/18/23 0029 10/18/23 0431  WBC 4.7 10.7*  --   CREATININE 1.83* 2.31* 2.21*  LATICACIDVEN  --  >9.0* 8.7*    Estimated Creatinine Clearance: 21.1 mL/min (A) (by C-G formula based on SCr of 2.21 mg/dL (H)).    No Known Allergies  Antimicrobials this admission:   >>    >>   Dose adjustments this admission:   Microbiology results:  BCx:   UCx:    Sputum:    MRSA PCR:   Thank you for allowing pharmacy to be a part of this patient's care.  Josanna Hefel D 10/18/2023 6:45 AM

## 2023-10-18 NOTE — Progress Notes (Addendum)
 Initial Nutrition Assessment  DOCUMENTATION CODES:   Non-severe (moderate) malnutrition in context of social or environmental circumstances  INTERVENTION:   Once tube feeds initiated, recommend:  Vital 1.2@60ml /hr- Initiate at 13ml/hr and increase by 10ml/hr q 8 hours until goal rate is reached.   Free water flushes 30ml q4 hours to maintain tube patency   Regimen provides 1728kcal/day, 108g/day protein and 1368ml/day of free water.   Thiamine 100mg  daily via tube x 7 days   Pt at high refeed risk; recommend monitor potassium, magnesium and phosphorus labs daily until stable  Daily weights   Check iron /anemia labs   NUTRITION DIAGNOSIS:   Moderate Malnutrition related to social / environmental circumstances as evidenced by moderate fat depletion, moderate muscle depletion.  GOAL:   Provide needs based on ASPEN/SCCM guidelines  MONITOR:   Vent status, Labs, Weight trends, Skin, I & O's, TF tolerance  REASON FOR ASSESSMENT:   Ventilator    ASSESSMENT:   89 y/o male with h/o IDA, diverticulosis, internal hemorrhoids, DM, HLD, PAD, GERD, HTN and PUD who is admitted with PEA arrest, anemia, new A-fib, suspected apiration, seizures and AKI.  Pt sedated and ventilated. OGT in place. Will hold on tube feeds today per NP. Pt is likely at high refeed risk. Per chart, pt appears weight stable at baseline.   Medications reviewed and include: solu-cortef, insulin, protonix , doxycycline, levophed, zosyn, sodium bicarbonate, vasopressin   Labs reviewed: K 4.7 wnl, BUN 35(H), creat 2.40(H), P 7.3(H), Mg 3.0(H) BNP- 809.2(H) Wbc- 10.7(H), Hgb 7.8(L), Hct 24.4(L), MCH 24.6(L), MCHC 28.3(L) Cbgs- 124, 170, 200 x 24 hrs   Patient is currently intubated on ventilator support MV: 12.2 L/min Temp (24hrs), Avg:95 F (35 C), Min:91.2 F (32.9 C), Max:99.7 F (37.6 C)  MAP >50mmHg   NUTRITION - FOCUSED PHYSICAL EXAM:  Flowsheet Row Most Recent Value  Orbital Region No  depletion  Upper Arm Region Moderate depletion  Thoracic and Lumbar Region Mild depletion  Buccal Region No depletion  Temple Region Mild depletion  Clavicle Bone Region Moderate depletion  Clavicle and Acromion Bone Region Moderate depletion  Scapular Bone Region No depletion  Dorsal Hand Mild depletion  Patellar Region Moderate depletion  Anterior Thigh Region Moderate depletion  Posterior Calf Region Moderate depletion  Edema (RD Assessment) None  Hair Reviewed  Eyes Reviewed  Mouth Reviewed  Skin Reviewed  Nails Reviewed   Diet Order:   Diet Order             Diet NPO time specified  Diet effective now                  EDUCATION NEEDS:   No education needs have been identified at this time  Skin:  Skin Assessment: Reviewed RN Assessment  Last BM:  pta  Height:   Ht Readings from Last 1 Encounters:  10/17/23 5' 8 (1.727 m)    Weight:   Wt Readings from Last 1 Encounters:  10/17/23 73.9 kg    Ideal Body Weight:  70 kg  BMI:  Body mass index is 24.78 kg/m.  Estimated Nutritional Needs:   Kcal:  1765kcal/day  Protein:  105-120g/day  Fluid:  1.8-2.1L/day  Augustin Shams MS, RD, LDN If unable to be reached, please send secure chat to RD inpatient available from 8:00a-4:00p daily

## 2023-10-18 NOTE — Procedures (Signed)
 Central Venous Catheter Insertion Procedure Note  Scott Hudson  [id number]  09-Mar-1929  Date:10/18/23  Time:12:23 AM   Provider Performing:Christerpher Clos L [place]   Procedure: Insertion of Non-tunneled Central Venous Catheter(36556) with US  guidance (23062)   Indication(s) Medication administration and Difficult access  Consent Unable to obtain consent due to emergent nature of procedure.  Anesthesia Topical only with 1% lidocaine  , fentanyl  & versed  IVP  Timeout Verified patient identification, verified procedure, site/side was marked, verified correct patient position, special equipment/implants available, medications/allergies/relevant history reviewed, required imaging and test results available.  Sterile Technique Maximal sterile technique including full sterile barrier drape, hand hygiene, sterile gown, sterile gloves, mask, hair covering, sterile ultrasound probe cover (if used).  Procedure Description Area of catheter insertion was cleaned with chlorhexidine and draped in sterile fashion.  With real-time ultrasound guidance a central venous catheter was placed into the right femoral vein. Nonpulsatile blood flow and easy flushing noted in all ports.  The catheter was sutured in place and sterile dressing applied.  Complications/Tolerance None; patient tolerated the procedure well. Chest X-ray is ordered to verify placement for internal jugular or subclavian cannulation.   Chest x-ray is not ordered for femoral cannulation.  EBL Minimal  Specimen(s) None  Scott Hudson, AGACNP-BC Acute Care Nurse Practitioner [facility]   [phone] / [phone] Please see Amion for details.

## 2023-10-18 NOTE — Progress Notes (Addendum)
 PHARMACY CONSULT NOTE - ELECTROLYTES  Pharmacy Consult for Electrolyte Monitoring and Replacement   Recent Labs: Height: 5' 8 (172.7 cm) Weight: 73.9 kg (163 lb) IBW/kg (Calculated) : 68.4 Estimated Creatinine Clearance: 21.1 mL/min (A) (by C-G formula based on SCr of 2.21 mg/dL (H)). Potassium (mmol/L)  Date Value  10/18/2023 4.6  07/26/2011 4.5   Magnesium (mg/dL)  Date Value  89/89/7974 3.0 (H)   Calcium  (mg/dL)  Date Value  89/89/7974 8.5 (L)   Calcium , Total (mg/dL)  Date Value  92/81/7986 9.0   Albumin (g/dL)  Date Value  89/89/7974 3.3 (L)  07/26/2011 3.4   Phosphorus (mg/dL)  Date Value  89/89/7974 7.3 (H)   Sodium (mmol/L)  Date Value  10/18/2023 142  07/26/2011 141   Corrected Ca: 9.1 mg/dL  Assessment  Scott Hudson is a 89 y.o. male presented with symptomatic anemia and complaints of SOB, chest pain with exertion, and generalized weakness. PMH significant for HTN, DM, PAD, anemia, GERD. 10/9, suffered a PEA arrest and was transferred to the ICU. Pharmacy has been consulted to monitor and replace electrolytes.  Diet: NPO  Fluids: sodium bicarbonate 150 mEq/1150mL infusion @ 7mL/hr  Goal of Therapy: Electrolytes WNL  Plan:  No replacement necessary at this time Check BMP, Mg, Phos with AM labs  Thank you for allowing pharmacy to be a part of this patient's care.  Bernardino George, PharmD Candidate [phone] [facility] [employer] 10/18/2023 8:41 AM

## 2023-10-18 NOTE — Plan of Care (Signed)
   Problem: Education: Goal: Ability to describe self-care measures that may prevent or decrease complications (Diabetes Survival Skills Education) will improve Outcome: Not Progressing Goal: Individualized Educational Video(s) Outcome: Not Progressing   Problem: Coping: Goal: Ability to adjust to condition or change in health will improve Outcome: Not Progressing   Problem: Fluid Volume: Goal: Ability to maintain a balanced intake and output will improve Outcome: Not Progressing   Problem: Health Behavior/Discharge Planning: Goal: Ability to identify and utilize available resources and services will improve Outcome: Not Progressing Goal: Ability to manage health-related needs will improve Outcome: Not Progressing   Problem: Metabolic: Goal: Ability to maintain appropriate glucose levels will improve Outcome: Not Progressing   Problem: Nutritional: Goal: Maintenance of adequate nutrition will improve Outcome: Not Progressing Goal: Progress toward achieving an optimal weight will improve Outcome: Not Progressing   Problem: Skin Integrity: Goal: Risk for impaired skin integrity will decrease Outcome: Not Progressing   Problem: Tissue Perfusion: Goal: Adequacy of tissue perfusion will improve Outcome: Not Progressing   Problem: Education: Goal: Knowledge of General Education information will improve Description: Including pain rating scale, medication(s)/side effects and non-pharmacologic comfort measures Outcome: Not Progressing   Problem: Health Behavior/Discharge Planning: Goal: Ability to manage health-related needs will improve Outcome: Not Progressing   Problem: Clinical Measurements: Goal: Ability to maintain clinical measurements within normal limits will improve Outcome: Not Progressing Goal: Will remain free from infection Outcome: Not Progressing Goal: Diagnostic test results will improve Outcome: Not Progressing Goal: Respiratory complications will  improve Outcome: Not Progressing Goal: Cardiovascular complication will be avoided Outcome: Not Progressing   Problem: Activity: Goal: Risk for activity intolerance will decrease Outcome: Not Progressing   Problem: Nutrition: Goal: Adequate nutrition will be maintained Outcome: Not Progressing   Problem: Coping: Goal: Level of anxiety will decrease Outcome: Not Progressing   Problem: Elimination: Goal: Will not experience complications related to bowel motility Outcome: Not Progressing Goal: Will not experience complications related to urinary retention Outcome: Not Progressing   Problem: Pain Managment: Goal: General experience of comfort will improve and/or be controlled Outcome: Not Progressing   Problem: Safety: Goal: Ability to remain free from injury will improve Outcome: Not Progressing   Problem: Skin Integrity: Goal: Risk for impaired skin integrity will decrease Outcome: Not Progressing   Problem: Activity: Goal: Ability to tolerate increased activity will improve Outcome: Not Progressing   Problem: Respiratory: Goal: Ability to maintain a clear airway and adequate ventilation will improve Outcome: Not Progressing   Problem: Role Relationship: Goal: Method of communication will improve Outcome: Not Progressing

## 2023-10-18 NOTE — Progress Notes (Signed)
 At approximately 2150 The patient came to the unit. He was able to  slide over from the gurney to the bed. After transfer He complained of back pain. When trying to ask the patient questions about toileting . He was short of breath after transfer and in pain. I told him I  would let him catch his breathe. The patient had nothing ordered for pain. Notified Md of pain.At 2239 the  Nurse tech called and made this nurse aware that IV pump was beeping. When I came to the room. This nurse was made aware that the IV came out. The patient was moving around in bed complaining of  pain. I was on the computer read the message from the doctor. Went to further assess the patient. Noticed a change in the patient more SOB slurring his speech and diaphoretic. Asked tech to call rapid. At 2247 code was called.

## 2023-10-18 NOTE — Procedures (Signed)
 History: 89 yo M with encephalopathy, eeg to rule out seizure as cause  EEG Duration: 25 minutes  Sedation: none  Patient State: comatose  Technique: This EEG was acquired with electrodes placed according to the International 10-20 electrode system (including Fp1, Fp2, F3, F4, C3, C4, P3, P4, O1, O2, T3, T4, T5, T6, A1, A2, Fz, Cz, Pz). The following electrodes were missing or displaced: none.   Background: The background consists of low voltage delta activity with some superimposed beta activity seen. There is no posterior dominant rhythm noted. There is prominent muscle artifact that obscures the frontal leads, but wihtin this limitation there was no definite epileptiform activity seen.   Photic stimulation: Physiologic driving is not performed  EEG Abnormalities: 1) diffuse irregular slow activity 2) Absent PDR  Clinical Interpretation: This EEG is consistent with a generalized non-specific cerebral dysfunction(encephalopathy). There was no seizure or seizure predisposition recorded on this study. Please note that lack of epileptiform activity on EEG does not preclude the possibility of epilepsy.   Aisha Seals, MD [facility]   If 7pm- 7am, please page neurology on call as listed in AMION.

## 2023-10-18 NOTE — Progress Notes (Signed)
 Pharmacy Antibiotic Note  Scott Hudson is a 89 y.o. male admitted on 10/17/2023 with aspiration PNA.  Pharmacy has been consulted for Unasyn dosing.  Plan: Unasyn 3 gm IV Q12H ordered to start on 10/10 @ ~ 0300.   Height: 5' 8 (172.7 cm) Weight: 73.9 kg (163 lb) IBW/kg (Calculated) : 68.4  Temp (24hrs), Avg:92.7 F (33.7 C), Min:91.2 F (32.9 C), Max:98.8 F (37.1 C)  Recent Labs  Lab 10/17/23 1639 10/18/23 0029  WBC 4.7 10.7*  CREATININE 1.83* 2.31*  LATICACIDVEN  --  >9.0*    Estimated Creatinine Clearance: 20.2 mL/min (A) (by C-G formula based on SCr of 2.31 mg/dL (H)).    No Known Allergies  Antimicrobials this admission:   >>    >>   Dose adjustments this admission:   Microbiology results:  BCx:   UCx:    Sputum:    MRSA PCR:   Thank you for allowing pharmacy to be a part of this patient's care.  Scott Hudson D 10/18/2023 3:07 AM

## 2023-10-19 ENCOUNTER — Inpatient Hospital Stay

## 2023-10-19 LAB — TYPE AND SCREEN
ABO/RH(D): O NEG
Antibody Screen: NEGATIVE
Unit division: 0
Unit division: 0
Unit division: 0
Unit division: 0

## 2023-10-19 LAB — PROCALCITONIN: Procalcitonin: 7.07 ng/mL

## 2023-10-19 LAB — RENAL FUNCTION PANEL
Albumin: 3 g/dL — ABNORMAL LOW (ref 3.5–5.0)
Anion gap: 16 — ABNORMAL HIGH (ref 5–15)
BUN: 48 mg/dL — ABNORMAL HIGH (ref 8–23)
CO2: 24 mmol/L (ref 22–32)
Calcium: 7.5 mg/dL — ABNORMAL LOW (ref 8.9–10.3)
Chloride: 101 mmol/L (ref 98–111)
Creatinine, Ser: 2.68 mg/dL — ABNORMAL HIGH (ref 0.61–1.24)
GFR, Estimated: 22 mL/min — ABNORMAL LOW (ref >60)
Glucose, Bld: 170 mg/dL — ABNORMAL HIGH (ref 70–99)
Phosphorus: 6.4 mg/dL — ABNORMAL HIGH (ref 2.5–4.6)
Potassium: 5.1 mmol/L (ref 3.5–5.1)
Sodium: 141 mmol/L (ref 135–145)

## 2023-10-19 LAB — HEMOGLOBIN AND HEMATOCRIT, BLOOD
HCT: 22.6 % — ABNORMAL LOW (ref 39.0–52.0)
HCT: 23.6 % — ABNORMAL LOW (ref 39.0–52.0)
HCT: 23.5 % — ABNORMAL LOW (ref 39.0–52.0)
HCT: 22.6 % — ABNORMAL LOW (ref 39.0–52.0)
Hemoglobin: 7.3 g/dL — ABNORMAL LOW (ref 13.0–17.0)
Hemoglobin: 7.4 g/dL — ABNORMAL LOW (ref 13.0–17.0)
Hemoglobin: 7.5 g/dL — ABNORMAL LOW (ref 13.0–17.0)
Hemoglobin: 7.2 g/dL — ABNORMAL LOW (ref 13.0–17.0)

## 2023-10-19 LAB — BPAM RBC
Blood Product Expiration Date: 202510182359
Blood Product Expiration Date: 202510182359
Blood Product Expiration Date: 202511142359
Blood Product Expiration Date: 202511152359
ISSUE DATE / TIME: 202510091955
ISSUE DATE / TIME: 202510092310
ISSUE DATE / TIME: 202510100206
ISSUE DATE / TIME: 202510102212
Unit Type and Rh: 5100
Unit Type and Rh: 5100
Unit Type and Rh: 5100
Unit Type and Rh: 5100

## 2023-10-19 LAB — HEPATIC FUNCTION PANEL
ALT: 90 U/L — ABNORMAL HIGH (ref 0–44)
AST: 103 U/L — ABNORMAL HIGH (ref 15–41)
Albumin: 3 g/dL — ABNORMAL LOW (ref 3.5–5.0)
Alkaline Phosphatase: 44 U/L (ref 38–126)
Bilirubin, Direct: 0.2 mg/dL (ref 0.0–0.2)
Indirect Bilirubin: 0.4 mg/dL (ref 0.3–0.9)
Total Bilirubin: 0.6 mg/dL (ref 0.0–1.2)
Total Protein: 5.7 g/dL — ABNORMAL LOW (ref 6.5–8.1)

## 2023-10-19 LAB — BLOOD GAS, ARTERIAL
Acid-Base Excess: 0.6 mmol/L (ref 0.0–2.0)
Bicarbonate: 26.6 mmol/L (ref 20.0–28.0)
FIO2: 100 %
MECHVT: 450 mL
Mechanical Rate: 14
O2 Saturation: 99.4 %
PEEP: 14 cmH2O
Patient temperature: 37
pCO2 arterial: 47 mmHg (ref 32–48)
pH, Arterial: 7.36 (ref 7.35–7.45)
pO2, Arterial: 276 mmHg — ABNORMAL HIGH (ref 83–108)

## 2023-10-19 LAB — CBC
HCT: 24.9 % — ABNORMAL LOW (ref 39.0–52.0)
Hemoglobin: 7.9 g/dL — ABNORMAL LOW (ref 13.0–17.0)
MCH: 26 pg (ref 26.0–34.0)
MCHC: 31.7 g/dL (ref 30.0–36.0)
MCV: 81.9 fL (ref 80.0–100.0)
Platelets: 121 K/uL — ABNORMAL LOW (ref 150–400)
RBC: 3.04 MIL/uL — ABNORMAL LOW (ref 4.22–5.81)
RDW: 17.6 % — ABNORMAL HIGH (ref 11.5–15.5)
WBC: 15.4 K/uL — ABNORMAL HIGH (ref 4.0–10.5)
nRBC: 1.6 % — ABNORMAL HIGH (ref 0.0–0.2)

## 2023-10-19 LAB — HEMOGLOBIN A1C
Hgb A1c MFr Bld: 6.5 % — ABNORMAL HIGH (ref 4.8–5.6)
Mean Plasma Glucose: 139.85 mg/dL

## 2023-10-19 LAB — GLUCOSE, CAPILLARY
Glucose-Capillary: 164 mg/dL — ABNORMAL HIGH (ref 70–99)
Glucose-Capillary: 136 mg/dL — ABNORMAL HIGH (ref 70–99)
Glucose-Capillary: 131 mg/dL — ABNORMAL HIGH (ref 70–99)
Glucose-Capillary: 99 mg/dL (ref 70–99)
Glucose-Capillary: 107 mg/dL — ABNORMAL HIGH (ref 70–99)
Glucose-Capillary: 103 mg/dL — ABNORMAL HIGH (ref 70–99)

## 2023-10-19 LAB — IRON AND TIBC
Iron: 19 ug/dL — ABNORMAL LOW (ref 45–182)
Saturation Ratios: 5 % — ABNORMAL LOW (ref 17.9–39.5)
TIBC: 396 ug/dL (ref 250–450)
UIBC: 377 ug/dL

## 2023-10-19 LAB — FERRITIN: Ferritin: 21 ng/mL — ABNORMAL LOW (ref 24–336)

## 2023-10-19 LAB — PROTIME-INR
INR: 1.3 — ABNORMAL HIGH (ref 0.8–1.2)
Prothrombin Time: 17.1 s — ABNORMAL HIGH (ref 11.4–15.2)

## 2023-10-19 LAB — VITAMIN B12: Vitamin B-12: 645 pg/mL (ref 180–914)

## 2023-10-19 LAB — TRIGLYCERIDES: Triglycerides: 113 mg/dL (ref <150)

## 2023-10-19 LAB — T4: T4, Total: 4.8 ug/dL (ref 4.5–12.0)

## 2023-10-19 LAB — FOLATE: Folate: >20 ng/mL (ref >5.9)

## 2023-10-19 MED ORDER — FENTANYL CITRATE (PF) 50 MCG/ML IJ SOSY
25.0000 ug | PREFILLED_SYRINGE | INTRAMUSCULAR | Status: DC | PRN
  Administered 2023-10-19 (×2): 25 ug via INTRAVENOUS
  Filled 2023-10-19 (×2): qty 1

## 2023-10-19 MED ORDER — PROPOFOL 1000 MG/100ML IV EMUL
0.0000 ug/kg/min | INTRAVENOUS | Status: DC
  Administered 2023-10-19 – 2023-10-20 (×2): 20 ug/kg/min via INTRAVENOUS
  Administered 2023-10-20 – 2023-10-21 (×3): 25 ug/kg/min via INTRAVENOUS
  Filled 2023-10-19 (×6): qty 100

## 2023-10-19 MED ORDER — CHLORHEXIDINE GLUCONATE CLOTH 2 % EX PADS
6.0000 | MEDICATED_PAD | Freq: Every day | CUTANEOUS | Status: DC
Start: 2023-10-20 — End: 2023-10-22
  Administered 2023-10-20 – 2023-10-22 (×3): 6 via TOPICAL

## 2023-10-19 MED ORDER — TRANEXAMIC ACID FOR INHALATION: 500.0000 mg | Freq: Three times a day (TID) | RESPIRATORY_TRACT | Status: DC

## 2023-10-19 MED ORDER — TRANEXAMIC ACID FOR INHALATION
500.0000 mg | Freq: Three times a day (TID) | RESPIRATORY_TRACT | Status: DC
  Administered 2023-10-20: 500 mg via RESPIRATORY_TRACT
  Filled 2023-10-19 (×3): qty 10

## 2023-10-19 NOTE — Progress Notes (Signed)
 NAME:  NEWELL WAFER, MRN:  [id number], DOB:  29-Aug-1929, LOS: 2 ADMISSION DATE:  10/17/2023, CONSULTATION DATE:  10/17/23 REFERRING MD:  Dr. Cleatus, CHIEF COMPLAINT:  Shortness of Breath   History of Present Illness:  89 yo M presenting to [facility] ED from home via EMS for evaluation of dyspnea and chest pain.  History obtained per chart review and family bedside report as patient is intubated and unresponsive at this time post cardiac arrest.  Patient was in his normal state of health until about 2 days prior to arrival (10/7) when he began experiencing shortness of breath particularly with exertion and chest discomfort intermittently. He also noticed generalized weakness. He endorsed dry cough, but denied fever/ chills. The patient on arrival also denied any signs of bleeding including no black/ tarry stools. He has also been complaining of significant lower back pain. Family confirmed no noticed fever/ chills, falls, AMS, LOC, blurred vision, signs of bleeding, abdominal pain/ nausea/ vomiting/ diarrhea, congestion and he has not been around anyone sick.  History of tobacco use, no ETOH/ recreational drug use.   ED course: Upon arrival, patient alert and responsive, afebrile with stable vitals on room air. Labs significant for severe anemia, elevated lipase, elevated BNP and elevated but flat troponin. Imaging showed pulmonary edema, pleural effusion and possible pneumonia. Patient guaiac positive but no frank bleeding. TRH consulted for admission.  Medications given: 40 mg of Lasix, 1 unit pRBC's (2nd ordered), protonix , azithromycin &  Initial Vitals: 97.8, 20, 86, 114/48 & 90% on RA  After admission to the medical floor, patient was sitting on the edge of the bed and talking to his son. The patient became lethargic dropping down to the bed and staring off. Staff was called who found the patient unresponsive with agonal breathing and called a Rapid response. Subsequently, the patient became  pulseless and CODE BLUE team was activated. Initial rhythm was PEA. ACLS protocol initiated and ROSC achieved after about 12 minutes of  suspected downtime. Patient was emergently intubated during the event.  Most recent Significant labs: (Labs/ Imaging personally reviewed) I, Jenita Ruth Rust-Chester, AGACNP-BC, personally viewed and interpreted this ECG. EKG Interpretation: Date: 10/17/23, EKG Time: 16:30, Rate: 90, Rhythm: NSR, QRS Axis:  LAD, Intervals: normal, ST/T Wave abnormalities: none, Narrative Interpretation: NSR with LAD Chemistry: Na+: 139, K+: 4.2, BUN/Cr.: 27/1.83, > 30/2.31 Serum CO2/ AG: 17/ 13 > 14/17 , Lipase: 55 Hematology: WBC: 4.7, Hgb: 4.8 > 6.0,  Troponin: 48 > 53, BNP: 855.7, Lactic/ PCT: >9/pending  ABG: 7.05/ 43/76/11.9 CXR 10/17/23: Cardiac enlargement with mild pulmonary vascular congestion. 2. Chronic bronchitic changes in the lungs. 3. Increasing infiltration in the left lung base likely representing pneumonia. Likely small left pleural effusion. CT abdomen/ pelvis w contrast: no acute abnormality in the abdomen, mild basilar atelectasis and small effusions  PCCM consulted for assistance in management and monitoring due to PEA arrest s/t unclear etiology, acute hypoxic respiratory failure requiring intubation and mechanical ventilatory support in the setting of severe anemia, suspected GIB, flash pulmonary edema and suspected new onset CHF and worsening AGMA.   10/19/23 patient weaned down to 65% fio2 but not having adequate neurological recovery.  I suspect this may be due to metabolic encephalopathy with ongoing renal impairment. We will keep off sedation for now and monitor closely.  I met with family at bedside and we reviewed medical plan and answered questions.   Pertinent  Medical History  HTN HLD PVD Anemia GERD T2DM Thrombocytopenia Former smoker (  60 total pack years)  Significant Hospital Events: Including procedures, antibiotic start and stop  dates in addition to other pertinent events   10/17/23: Admit to medical surgical unit with severe anemia s/t suspected GIB. Overnight PEA arrest s/t unclear etiology, acute hypoxic respiratory failure requiring intubation and mechanical ventilatory support in the setting of flash pulmonary edema and suspected new onset CHF and worsening AGMA.  Interim History / Subjective:  Patient unresponsive and unable to follow commands. Upon arrival to ICU, significant facial and right thigh rhythmic twitching with correlating right downward gaze noted. Episode lasted > 5 minutes, requiring 3 mg of versed  before ceasing. Son and daughter present, updated on clinical status and plan of care all questions answered at this time.  Bedside POCUS performed with E-link Intensivist assistance. RV & LV function does not appear to be significantly reduced. Diffuse B-lines noted in bilateral lungs. Objective    Blood pressure (!) 103/49, pulse 63, temperature 98.6 F (37 C), resp. rate 15, height 5' 8 (1.727 m), weight 74.1 kg, SpO2 99%.    Vent Mode: PRVC FiO2 (%):  [80 %-100 %] 80 % Set Rate:  [14 bmp] 14 bmp Vt Set:  [450 mL] 450 mL PEEP:  [0 cmH20-14 cmH20] 14 cmH20 Plateau Pressure:  [16 cmH20-31 cmH20] 24 cmH20   Intake/Output Summary (Last 24 hours) at 10/19/2023 0951 Last data filed at 10/19/2023 9183 Gross per 24 hour  Intake 3662.69 ml  Output 480 ml  Net 3182.69 ml   Filed Weights   10/17/23 1638 10/19/23 0432  Weight: 73.9 kg 74.1 kg    Examination: General: Adult male, critically ill, lying in bed intubated requiring mechanical ventilation  NAD HEENT: MM pink/moist, anicteric, atraumatic, neck supple Neuro: RASS: -4, unable to follow commands, PERRL +2, rhythmic facial and right thigh twitching with right downward gaze deviation CV: s1s2 irregular, A-fib on monitor, no r/m/g Pulm: Regular, non labored on PRVC 100% & PEEP 8, breath sounds coarse crackles with expiratory wheezing-BUL &  diminished-BLL GI: soft, rounded, bs x 4 Skin: no rashes/lesions noted Extremities: warm/dry, pulses + 2 R/P, no edema noted   Assessment and Plan  Cardiac arrest: initial rhythm PEA  Circulatory shock Suspected New Onset Atrial Fibrillation  Elevated BNP and suspected flash pulmonary edema 12 + minutes downtime, suspected anoxic injury POCUS evaluated with E-link intensivist, low suspicion for PE due to RV function. Unable to heparinize due to suspected GIB. Cr >2, will hold off on CT angio at this time - candidate for Normothermia Protocol - Continue vasopressors: levophed & vasopressin PRN, to maintain MAP > 65 - Echocardiogram ordered - f/u BLE doppler US  - consider Amiodarone bolus and infusion if RVR develops - Unable to Heparinize due suspected GIB - f/u TSH & thyroid panel - diurese PRN with lasix as renal function and hemodynamics allow - consult cardiology, appreciate input - Trend troponin, lactic, STAT labs - Cardiology consulted, appreciate input - Hold preadmission medication: hydralazine & Imdur   Acute hypoxic respiratory failure suspect secondary to flash pulmonary edema in the setting of acute encephalopathy and cardiac arrest Suspected CAP Suspected Aspiration  - Ventilator settings: PRVC 8 mL/kg, 100% FiO2, 5 PEEP - Wean PEEP and FiO2 for sats greater than 90% - Plateau pressures less than 30 cm H20 - VAP bundle in place - Intermittent chest x-ray & ABG - Daily WUA/ SBT as tolerated - Ensure adequate pulmonary hygiene  - F/u cultures, trend PCT - Initiate Aspiration Pna coverage: Unasyn  Suspected Seizure activity At  risk for anoxic encephalopathy - STAT CT head once stabilized - PAD protocol in place: midazolam  & fentanyl  IVP PRN - RASS goal: 0, -1 - EEG ordered - loaded with Keppra, followed by 500 mg BID - repeat CT head - Neuro consulted  Suspected Acute Kidney Injury on CKD? Worsening AGMA No history of CKD but renal function in 2020 and 2021  revealed possible CKD stage 3a Baseline Cr: most recent from 2021- 1.49, Cr on admission: 1.83 - Strict I/O's: alert provider if UOP < 0.5 mL/kg/hr - sodium bicarbonate drip started  - f/u beta hydroxy, Daily BMP, replace electrolytes PRN - Avoid nephrotoxic agents as able, ensure adequate renal perfusion - Consider nephrology consultation if iHD or CRRT indicated  - consider renal US  if trend significantly worsens   Severe Anemia s/t Suspected Gastrointestinal Bleed PMHx: PUD, GERD - continue Protonix  BID - Maintain up to date type & screen - 3rd unit of pRBC's ordered - continuous cardiac monitoring - NPO - Monitor for s/s of bleeding - Daily CBC, PT/ INR monitoring PRN - Transfuse for Hgb <7 - GI consulted, appreciate input  Transaminitis suspect Shock liver secondary to cardiac arrest Mildly Elevated Lipase without evidence of pancreatitis on CT imaging - Trend hepatic function - Consider RUQ US  PRN - avoid hepatotoxic agents - hold statin for now, consider restarting as patient stabilizes  Type 2 Diabetes Mellitus Hemoglobin A1C: pending - Monitor CBG Q 4 hours - SSI moderate dosing - target range while in ICU: 140-180 - follow ICU hyper/hypo-glycemia protocol  Suspected BPH Unable to place foley catheter due to anatomy. Urinary retention. Unable to place coude - Bladder scan Q 6 - if bladder volume reaches > 500 mL prior to day shift will consult urology urgently, otherwise urology consultation in AM for catheter placement  Labs   CBC: Recent Labs  Lab 10/17/23 1639 10/18/23 0029 10/18/23 1206 10/18/23 1805 10/19/23 0107 10/19/23 0356 10/19/23 0848  WBC 4.7 10.7*  --   --   --  15.4*  --   HGB 4.8* 6.0* 7.8* 7.0* 7.3* 7.9* 7.4*  HCT 16.5* 21.2* 24.4* 21.9* 22.6* 24.9* 23.6*  MCV 83.3 86.9  --   --   --  81.9  --   PLT 180 204  --   --   --  121*  --     Basic Metabolic Panel: Recent Labs  Lab 10/17/23 1639 10/18/23 0029 10/18/23 0431 10/18/23 1140  10/19/23 0356  NA 139 140 142 144 141  K 4.2 4.5 4.6 4.7 5.1  CL 109 109 107 105 101  CO2 17* 14* 18* 24 24  GLUCOSE 140* 241* 208* 147* 170*  BUN 27* 30* 33* 35* 48*  CREATININE 1.83* 2.31* 2.21* 2.40* 2.68*  CALCIUM  9.0 8.4* 8.5* 8.1* 7.5*  MG  --  3.2* 3.0*  --   --   PHOS  --  8.5* 7.3*  --  6.4*   GFR: Estimated Creatinine Clearance: 17.4 mL/min (A) (by C-G formula based on SCr of 2.68 mg/dL (H)). Recent Labs  Lab 10/17/23 1639 10/18/23 0029 10/18/23 0431 10/18/23 0827 10/18/23 1140 10/19/23 0356  PROCALCITON  --   --  0.82  --   --  7.07  WBC 4.7 10.7*  --   --   --  15.4*  LATICACIDVEN  --  >9.0* 8.7* 6.3* 2.7*  --     Liver Function Tests: Recent Labs  Lab 10/17/23 1639 10/18/23 0029 10/18/23 0431 10/19/23 0356  AST  32 125* 146* 103*  ALT 17 110* 116* 90*  ALKPHOS 56 56 60 44  BILITOT 0.4 0.5 0.7 0.6  PROT 6.5 5.9* 6.2* 5.7*  ALBUMIN 3.6 3.1* 3.3* 3.0*  3.0*   Recent Labs  Lab 10/17/23 1639 10/18/23 0431  LIPASE 55* 53*   Recent Labs  Lab 10/18/23 0431  AMMONIA <13    ABG    Component Value Date/Time   PHART 7.36 10/19/2023 0526   PCO2ART 47 10/19/2023 0526   PO2ART 276 (H) 10/19/2023 0526   HCO3 26.6 10/19/2023 0526   ACIDBASEDEF 3.1 (H) 10/18/2023 0840   O2SAT 99.4 10/19/2023 0526     Coagulation Profile: Recent Labs  Lab 10/18/23 0029 10/19/23 0356  INR 1.4* 1.3*    Cardiac Enzymes: No results for input(s): CKTOTAL, CKMB, CKMBINDEX, TROPONINI in the last 168 hours.  HbA1C: No results found for: HGBA1C  CBG: Recent Labs  Lab 10/18/23 1548 10/18/23 2007 10/18/23 2343 10/19/23 0359 10/19/23 0804  GLUCAP 98 100* 119* 164* 136*    Review of Systems:   UTA- patient unresponsive on mechanical vent support  Past Medical History:  He,  has a past medical history of Anemia, Diabetes mellitus without complication (HCC), GERD (gastroesophageal reflux disease), Hypercholesteremia, Hypertension, Peripheral vascular  disease, and Thrombocytopenia.   Surgical History:   Past Surgical History:  Procedure Laterality Date   COLONOSCOPY     COLONOSCOPY WITH PROPOFOL  N/A 11/08/2015   Procedure: COLONOSCOPY WITH PROPOFOL ;  Surgeon: Rogelia Copping, MD;  Location: [facility] ENDOSCOPY;  Service: Endoscopy;  Laterality: N/A;   LEFT HEART CATH AND CORONARY ANGIOGRAPHY Left 12/03/2017   Procedure: LEFT HEART CATH AND CORONARY ANGIOGRAPHY;  Surgeon: Bosie Vinie LABOR, MD;  Location: [facility] INVASIVE CV LAB;  Service: Cardiovascular;  Laterality: Left;   PERIPHERAL VASCULAR CATHETERIZATION  11/30/2014   Procedure: Lower Extremity Intervention;  Surgeon: Cordella KANDICE Shawl, MD;  Location: [facility] INVASIVE CV LAB;  Service: Cardiovascular;;   PERIPHERAL VASCULAR CATHETERIZATION N/A 11/30/2014   Procedure: Abdominal Aortogram w/Lower Extremity;  Surgeon: Cordella KANDICE Shawl, MD;  Location: [facility] INVASIVE CV LAB;  Service: Cardiovascular;  Laterality: N/A;   rt hand trama, tips of two fingers removed       Social History:   reports that he quit smoking about 9 years ago. His smoking use included cigarettes. He started smoking about 49 years ago. He has a 60 pack-year smoking history. He has never used smokeless tobacco. He reports that he does not drink alcohol and does not use drugs.   Family History:  His family history includes Dementia in his brother.   Allergies No Known Allergies   Home Medications  Prior to Admission medications   Medication Sig Start Date End Date Taking? Authorizing Provider  atorvastatin  (LIPITOR) 10 MG tablet Take 10 mg by mouth every evening.    [provider]  atorvastatin  (LIPITOR) 20 MG tablet Take 20 mg by mouth every evening. 12/18/16   [provider]  ferrous fumarate -iron  polysaccharide complex (TANDEM) 162-115.2 MG CAPS capsule Take 1 capsule by mouth daily with breakfast. 10/28/15   Brahmanday, Govinda R, MD  hydrALAZINE  (APRESOLINE ) 10 MG tablet Take 10 mg by mouth daily.   06/09/15   [provider]  isosorbide  mononitrate (IMDUR ) 30 MG 24 hr tablet Take 30 mg by mouth daily. 07/28/15   [provider]  omeprazole (PRILOSEC) 40 MG capsule Take 40 mg by mouth daily. Reported on 06/16/2015    [provider]  pantoprazole  (PROTONIX ) 40 MG  tablet Take 1 tablet (40 mg total) by mouth daily. 02/16/18 02/16/19  Dorothyann Drivers, MD  tamsulosin  (FLOMAX ) 0.4 MG CAPS capsule Take 0.4 mg by mouth every evening. 05/29/15   [provider]  traMADol  (ULTRAM ) 50 MG tablet Take 0.5 tablets (25 mg total) by mouth every 6 (six) hours as needed. 02/16/18   Dorothyann Drivers, MD     Critical care provider statement:   Total critical care time: 33 minutes   Performed by: Parris MD   Critical care time was exclusive of separately billable procedures and treating other patients.   Critical care was necessary to treat or prevent imminent or life-threatening deterioration.   Critical care was time spent personally by me on the following activities: development of treatment plan with patient and/or surrogate as well as nursing, discussions with consultants, evaluation of patient's response to treatment, examination of patient, obtaining history from patient or surrogate, ordering and performing treatments and interventions, ordering and review of laboratory studies, ordering and review of radiographic studies, pulse oximetry and re-evaluation of patient's condition.    Honestee Revard, M.D.  Pulmonary & Critical Care Medicine

## 2023-10-19 NOTE — Progress Notes (Signed)
 All sedation turned off per MD order for WUA. FIO2 decreased to 60% and peep decreased to 12. RT and NP aware.

## 2023-10-19 NOTE — Plan of Care (Signed)
  Problem: Metabolic: Goal: Ability to maintain appropriate glucose levels will improve Outcome: Progressing   Problem: Nutritional: Goal: Maintenance of adequate nutrition will improve Outcome: Progressing   Problem: Skin Integrity: Goal: Risk for impaired skin integrity will decrease Outcome: Progressing   Problem: Tissue Perfusion: Goal: Adequacy of tissue perfusion will improve Outcome: Progressing   Problem: Clinical Measurements: Goal: Ability to maintain clinical measurements within normal limits will improve Outcome: Progressing   Problem: Safety: Goal: Ability to remain free from injury will improve Outcome: Progressing

## 2023-10-19 NOTE — Progress Notes (Signed)
 PHARMACY CONSULT NOTE - ELECTROLYTES  Pharmacy Consult for Electrolyte Monitoring and Replacement   Recent Labs: Height: 5' 8 (172.7 cm) Weight: 74.1 kg (163 lb 5.8 oz) IBW/kg (Calculated) : 68.4 Estimated Creatinine Clearance: 17.4 mL/min (A) (by C-G formula based on SCr of 2.68 mg/dL (H)). Potassium (mmol/L)  Date Value  10/19/2023 5.1  07/26/2011 4.5   Magnesium (mg/dL)  Date Value  89/89/7974 3.0 (H)   Calcium  (mg/dL)  Date Value  89/88/7974 7.5 (L)   Calcium , Total (mg/dL)  Date Value  92/81/7986 9.0   Albumin (g/dL)  Date Value  89/88/7974 3.0 (L)  10/19/2023 3.0 (L)  07/26/2011 3.4   Phosphorus (mg/dL)  Date Value  89/88/7974 6.4 (H)   Sodium (mmol/L)  Date Value  10/19/2023 141  07/26/2011 141   Assessment  Scott Hudson is a 89 y.o. male presented with symptomatic anemia and complaints of SOB, chest pain with exertion, and generalized weakness. PMH significant for HTN, DM, PAD, anemia, GERD. 10/9, suffered a PEA arrest and was transferred to the ICU. Pharmacy has been consulted to monitor and replace electrolytes.  Diet: NPO, on ventilator w/ OG tube Fluids: Bicarb gtt at 75 cc/hr  Goal of Therapy: Electrolytes WNL  Plan:  No replacement necessary at this time Check BMP, Mg, Phos with AM labs  Thank you for allowing pharmacy to be a part of this patient's care.  Marolyn KATHEE Mare 10/19/2023 7:09 AM

## 2023-10-20 ENCOUNTER — Inpatient Hospital Stay

## 2023-10-20 LAB — RENAL FUNCTION PANEL
Albumin: 2.9 g/dL — ABNORMAL LOW (ref 3.5–5.0)
Anion gap: 16 — ABNORMAL HIGH (ref 5–15)
BUN: 57 mg/dL — ABNORMAL HIGH (ref 8–23)
CO2: 27 mmol/L (ref 22–32)
Calcium: 7.4 mg/dL — ABNORMAL LOW (ref 8.9–10.3)
Chloride: 101 mmol/L (ref 98–111)
Creatinine, Ser: 2.65 mg/dL — ABNORMAL HIGH (ref 0.61–1.24)
GFR, Estimated: 22 mL/min — ABNORMAL LOW (ref >60)
Glucose, Bld: 116 mg/dL — ABNORMAL HIGH (ref 70–99)
Phosphorus: 6.8 mg/dL — ABNORMAL HIGH (ref 2.5–4.6)
Potassium: 3.9 mmol/L (ref 3.5–5.1)
Sodium: 144 mmol/L (ref 135–145)

## 2023-10-20 LAB — HEMOGLOBIN AND HEMATOCRIT, BLOOD
HCT: 22.9 % — ABNORMAL LOW (ref 39.0–52.0)
HCT: 23.1 % — ABNORMAL LOW (ref 39.0–52.0)
Hemoglobin: 7.3 g/dL — ABNORMAL LOW (ref 13.0–17.0)
Hemoglobin: 7.3 g/dL — ABNORMAL LOW (ref 13.0–17.0)

## 2023-10-20 LAB — CBC
HCT: 23.1 % — ABNORMAL LOW (ref 39.0–52.0)
Hemoglobin: 7.2 g/dL — ABNORMAL LOW (ref 13.0–17.0)
MCH: 25.4 pg — ABNORMAL LOW (ref 26.0–34.0)
MCHC: 31.2 g/dL (ref 30.0–36.0)
MCV: 81.6 fL (ref 80.0–100.0)
Platelets: 114 K/uL — ABNORMAL LOW (ref 150–400)
RBC: 2.83 MIL/uL — ABNORMAL LOW (ref 4.22–5.81)
RDW: 17.6 % — ABNORMAL HIGH (ref 11.5–15.5)
WBC: 12.9 K/uL — ABNORMAL HIGH (ref 4.0–10.5)
nRBC: 1.2 % — ABNORMAL HIGH (ref 0.0–0.2)

## 2023-10-20 LAB — BLOOD GAS, ARTERIAL
Acid-Base Excess: 5.5 mmol/L — ABNORMAL HIGH (ref 0.0–2.0)
Bicarbonate: 31.1 mmol/L — ABNORMAL HIGH (ref 20.0–28.0)
FIO2: 60 %
MECHVT: 450 mL
Mechanical Rate: 15
O2 Saturation: 94.2 %
PEEP: 8 cmH2O
Patient temperature: 37
pCO2 arterial: 48 mmHg (ref 32–48)
pH, Arterial: 7.42 (ref 7.35–7.45)
pO2, Arterial: 67 mmHg — ABNORMAL LOW (ref 83–108)

## 2023-10-20 LAB — GLUCOSE, CAPILLARY
Glucose-Capillary: 102 mg/dL — ABNORMAL HIGH (ref 70–99)
Glucose-Capillary: 126 mg/dL — ABNORMAL HIGH (ref 70–99)
Glucose-Capillary: 83 mg/dL (ref 70–99)
Glucose-Capillary: 88 mg/dL (ref 70–99)
Glucose-Capillary: 94 mg/dL (ref 70–99)
Glucose-Capillary: 97 mg/dL (ref 70–99)
Glucose-Capillary: 97 mg/dL (ref 70–99)

## 2023-10-20 LAB — C-REACTIVE PROTEIN: CRP: 11.2 mg/dL — ABNORMAL HIGH (ref <1.0)

## 2023-10-20 LAB — MAGNESIUM: Magnesium: 2.8 mg/dL — ABNORMAL HIGH (ref 1.7–2.4)

## 2023-10-20 LAB — FIBRINOGEN: Fibrinogen: 401 mg/dL (ref 210–475)

## 2023-10-20 LAB — SEDIMENTATION RATE: Sed Rate: 46 mm/h — ABNORMAL HIGH (ref 0–20)

## 2023-10-20 MED ORDER — VECURONIUM BROMIDE 10 MG IV SOLR
10.0000 mg | Freq: Once | INTRAVENOUS | Status: AC
  Administered 2023-10-20: 10 mg via INTRAVENOUS
  Filled 2023-10-20: qty 10

## 2023-10-20 MED ORDER — FUROSEMIDE 10 MG/ML IJ SOLN
20.0000 mg | Freq: Once | INTRAMUSCULAR | Status: AC
  Administered 2023-10-20: 20 mg via INTRAVENOUS
  Filled 2023-10-20: qty 2

## 2023-10-20 MED ORDER — FENTANYL CITRATE (PF) 50 MCG/ML IJ SOSY
25.0000 ug | PREFILLED_SYRINGE | INTRAMUSCULAR | Status: DC | PRN
  Administered 2023-10-20: 50 ug via INTRAVENOUS
  Administered 2023-10-20 (×2): 100 ug via INTRAVENOUS
  Administered 2023-10-21 (×2): 50 ug via INTRAVENOUS
  Administered 2023-10-21: 100 ug via INTRAVENOUS
  Administered 2023-10-22: 50 ug via INTRAVENOUS
  Filled 2023-10-20 (×3): qty 1
  Filled 2023-10-20 (×4): qty 2

## 2023-10-20 MED ORDER — PIPERACILLIN-TAZOBACTAM IN DEX 2-0.25 GM/50ML IV SOLN
2.2500 g | Freq: Four times a day (QID) | INTRAVENOUS | Status: DC
  Administered 2023-10-20 (×2): 2.25 g via INTRAVENOUS
  Filled 2023-10-20 (×4): qty 50

## 2023-10-20 MED ORDER — PIPERACILLIN-TAZOBACTAM IN DEX 2-0.25 GM/50ML IV SOLN
2.2500 g | Freq: Three times a day (TID) | INTRAVENOUS | Status: DC
  Administered 2023-10-20 – 2023-10-22 (×6): 2.25 g via INTRAVENOUS
  Filled 2023-10-20 (×7): qty 50

## 2023-10-20 MED ORDER — FUROSEMIDE 10 MG/ML IJ SOLN
INTRAMUSCULAR | Status: AC
  Filled 2023-10-20: qty 2

## 2023-10-20 MED ORDER — FUROSEMIDE 10 MG/ML IJ SOLN
20.0000 mg | Freq: Once | INTRAMUSCULAR | Status: AC
  Administered 2023-10-20: 20 mg via INTRAVENOUS

## 2023-10-20 MED ORDER — STERILE WATER FOR INJECTION IJ SOLN
INTRAMUSCULAR | Status: AC
  Administered 2023-10-20: 10 mL
  Filled 2023-10-20: qty 10

## 2023-10-20 NOTE — Progress Notes (Signed)
 Notified by primary RN that patient's oxygen saturation dropped to the low 70's on Vent Mode: PRVC FiO2 [45 % Set Rate: [14 bmp] 14 bmp Vt Set:  [450 mL] 450 mL PEEP: 8 cmH20, Plateau Pressure: [10 cmH20-24 cmH20] 12 cmH20. Fio2 increased to 100% and suctioned by nursing staff. He was noted with bloody secretion in his ETT tube. Due to concern for alveolar hemorrhage with prior drop in hgb, a STAT CT chest was obtained.  IMPRESSION: Bilateral anterior rib fractures as described related to recent CPR. Diffuse infiltrate is noted bilaterally. This has increased in the interval from the prior plain film examination. These changes may represent alveolar hemorrhage given the history of hemoptysis.   #Acute Hypoxic Respiratory Failure #Aspiration Pneumonia #Diffuse Alveolar Hemorrhage #?ARDS -con't full vent support -titrate FiO2, PEEP to maintain O2 sat >90%  -high PEEP and permissive hypercapnia to reduce active bleeding and prevent lung collapse -PRN Chest X-ray & ABG -Follow cultures, respiratory panel, strep pneumo and Legionella antigen -Broaden coverage with Zosyn -Start Nebulized TXA -Hold Anticoagulation -Bronchoscopy to obtain BAL and localize source of hemorrhage if indicated -prn fentanyl , prop for RASS -1 and Vec for vent dyssynchrony     Almarie Nose, DNP, CCRN, FNP-C, AGACNP-BC Acute Care & Family Nurse Practitioner  [facility]  PCCM on call pager [phone] until 7 am

## 2023-10-20 NOTE — Progress Notes (Signed)
 OT Cancellation Note  Patient Details Name: SHAVON ZENZ MRN: [id number] DOB: 1929-11-10   Cancelled Treatment:    Reason Eval/Treat Not Completed: Patient not medically ready. Pt w/ MEWS score of 4, limited consciousness, RR of 7. Will proceed with OT eval as pt is medically appropriate.  Suzen Hock 10/20/2023, 2:43 PM

## 2023-10-20 NOTE — Progress Notes (Signed)
 PHARMACY CONSULT NOTE - ELECTROLYTES  Pharmacy Consult for Electrolyte Monitoring and Replacement   Recent Labs: Height: 5' 8 (172.7 cm) Weight: 77.4 kg (170 lb 10.2 oz) IBW/kg (Calculated) : 68.4 Estimated Creatinine Clearance: 17.6 mL/min (A) (by C-G formula based on SCr of 2.65 mg/dL (H)). Potassium (mmol/L)  Date Value  10/20/2023 3.9  07/26/2011 4.5   Magnesium (mg/dL)  Date Value  89/87/7974 2.8 (H)   Calcium  (mg/dL)  Date Value  89/87/7974 7.4 (L)   Calcium , Total (mg/dL)  Date Value  92/81/7986 9.0   Albumin (g/dL)  Date Value  89/87/7974 2.9 (L)  07/26/2011 3.4   Phosphorus (mg/dL)  Date Value  89/87/7974 6.8 (H)   Sodium (mmol/L)  Date Value  10/20/2023 144  07/26/2011 141   Assessment  Scott Hudson is a 89 y.o. male presented with symptomatic anemia and complaints of SOB, chest pain with exertion, and generalized weakness. PMH significant for HTN, DM, PAD, anemia, GERD. 10/9, suffered a PEA arrest and was transferred to the ICU. Pharmacy has been consulted to monitor and replace electrolytes.  Diet: NPO, on ventilator w/ OG tube  Goal of Therapy: Electrolytes WNL  Plan:  No replacement needed at this time Renal function panel is ordered for tomorrow AM  Thank you for allowing pharmacy to be a part of this patient's care.  Marolyn KATHEE Mare 10/20/2023 7:01 AM

## 2023-10-20 NOTE — Progress Notes (Signed)
 Pharmacy Antibiotic Note  Scott Hudson is a 89 y.o. male admitted on 10/17/2023 with aspiration PNA.  Pharmacy has been consulted for zosyn dosing.  Plan: Zosyn 2.25 gm IV Q6H ordered to start 10/12 @ ~ 0000.   Height: 5' 8 (172.7 cm) Weight: 74.1 kg (163 lb 5.8 oz) IBW/kg (Calculated) : 68.4  Temp (24hrs), Avg:97.7 F (36.5 C), Min:95 F (35 C), Max:98.8 F (37.1 C)  Recent Labs  Lab 10/17/23 1639 10/18/23 0029 10/18/23 0431 10/18/23 0827 10/18/23 1140 10/19/23 0356  WBC 4.7 10.7*  --   --   --  15.4*  CREATININE 1.83* 2.31* 2.21*  --  2.40* 2.68*  LATICACIDVEN  --  >9.0* 8.7* 6.3* 2.7*  --     Estimated Creatinine Clearance: 17.4 mL/min (A) (by C-G formula based on SCr of 2.68 mg/dL (H)).    No Known Allergies  Antimicrobials this admission:   >>   >>   Dose adjustments this admission:   Microbiology results:  BCx:   UCx:    Sputum:    MRSA PCR:   Thank you for allowing pharmacy to be a part of this patient's care.  Elidia Bonenfant D 10/20/2023 12:04 AM

## 2023-10-20 NOTE — Progress Notes (Addendum)
 NAME:  ATTIKUS BARTOSZEK, MRN:  [id number], DOB:  1929/05/16, LOS: 3 ADMISSION DATE:  10/17/2023, CONSULTATION DATE:  10/17/23 REFERRING MD:  Dr. Cleatus, CHIEF COMPLAINT:  Shortness of Breath   History of Present Illness:  89 yo M presenting to [facility] ED from home via EMS for evaluation of dyspnea and chest pain.  History obtained per chart review and family bedside report as patient is intubated and unresponsive at this time post cardiac arrest.  Patient was in his normal state of health until about 2 days prior to arrival (10/7) when he began experiencing shortness of breath particularly with exertion and chest discomfort intermittently. He also noticed generalized weakness. He endorsed dry cough, but denied fever/ chills. The patient on arrival also denied any signs of bleeding including no black/ tarry stools. He has also been complaining of significant lower back pain. Family confirmed no noticed fever/ chills, falls, AMS, LOC, blurred vision, signs of bleeding, abdominal pain/ nausea/ vomiting/ diarrhea, congestion and he has not been around anyone sick.  History of tobacco use, no ETOH/ recreational drug use.   ED course: Upon arrival, patient alert and responsive, afebrile with stable vitals on room air. Labs significant for severe anemia, elevated lipase, elevated BNP and elevated but flat troponin. Imaging showed pulmonary edema, pleural effusion and possible pneumonia. Patient guaiac positive but no frank bleeding. TRH consulted for admission.  Medications given: 40 mg of Lasix, 1 unit pRBC's (2nd ordered), protonix , azithromycin &  Initial Vitals: 97.8, 20, 86, 114/48 & 90% on RA  After admission to the medical floor, patient was sitting on the edge of the bed and talking to his son. The patient became lethargic dropping down to the bed and staring off. Staff was called who found the patient unresponsive with agonal breathing and called a Rapid response. Subsequently, the patient became  pulseless and CODE BLUE team was activated. Initial rhythm was PEA. ACLS protocol initiated and ROSC achieved after about 12 minutes of  suspected downtime. Patient was emergently intubated during the event.  Most recent Significant labs: (Labs/ Imaging personally reviewed) I, Jenita Ruth Rust-Chester, AGACNP-BC, personally viewed and interpreted this ECG. EKG Interpretation: Date: 10/17/23, EKG Time: 16:30, Rate: 90, Rhythm: NSR, QRS Axis:  LAD, Intervals: normal, ST/T Wave abnormalities: none, Narrative Interpretation: NSR with LAD Chemistry: Na+: 139, K+: 4.2, BUN/Cr.: 27/1.83, > 30/2.31 Serum CO2/ AG: 17/ 13 > 14/17 , Lipase: 55 Hematology: WBC: 4.7, Hgb: 4.8 > 6.0,  Troponin: 48 > 53, BNP: 855.7, Lactic/ PCT: >9/pending  ABG: 7.05/ 43/76/11.9 CXR 10/17/23: Cardiac enlargement with mild pulmonary vascular congestion. 2. Chronic bronchitic changes in the lungs. 3. Increasing infiltration in the left lung base likely representing pneumonia. Likely small left pleural effusion. CT abdomen/ pelvis w contrast: no acute abnormality in the abdomen, mild basilar atelectasis and small effusions  PCCM consulted for assistance in management and monitoring due to PEA arrest s/t unclear etiology, acute hypoxic respiratory failure requiring intubation and mechanical ventilatory support in the setting of severe anemia, suspected GIB, flash pulmonary edema and suspected new onset CHF and worsening AGMA.   10/19/23 -patient weaned down to 65% fio2 but not having adequate neurological recovery.  I suspect this may be due to metabolic encephalopathy with ongoing renal impairment. We will keep off sedation for now and monitor closely.  I met with family at bedside and we reviewed medical plan and answered questions.   10/20/23- patient had desaturation event overnight.  S/p CT chest with findings consistent with  ARDS from [facility].  Report sent to blood bank medical director. He is diuresing today with lasix challenge  low dose producing adequate urine. I met with family to reivew medical plan.  Patient weaned on Fio2 from 80 to 60%.  His bronchoscopy showed NO alveolar hemorrhage.   Pertinent  Medical History  HTN HLD PVD Anemia GERD T2DM Thrombocytopenia Former smoker (60 total pack years)  Significant Hospital Events: Including procedures, antibiotic start and stop dates in addition to other pertinent events   10/17/23: Admit to medical surgical unit with severe anemia s/t suspected GIB. Overnight PEA arrest s/t unclear etiology, acute hypoxic respiratory failure requiring intubation and mechanical ventilatory support in the setting of flash pulmonary edema and suspected new onset CHF and worsening AGMA.    Objective    Blood pressure (!) 105/48, pulse 70, temperature (!) 96.8 F (36 C), resp. rate 20, height 5' 8 (1.727 m), weight 77.4 kg, SpO2 94%.    Vent Mode: PRVC FiO2 (%):  [45 %-100 %] 70 % Set Rate:  [14 bmp] 14 bmp Vt Set:  [450 mL] 450 mL PEEP:  [5 cmH20-9 cmH20] 9 cmH20 Plateau Pressure:  [10 cmH20-13 cmH20] 13 cmH20   Intake/Output Summary (Last 24 hours) at 10/20/2023 0746 Last data filed at 10/20/2023 0600 Gross per 24 hour  Intake 1843.46 ml  Output 1110 ml  Net 733.46 ml   Filed Weights   10/17/23 1638 10/19/23 0432 10/20/23 0500  Weight: 73.9 kg 74.1 kg 77.4 kg    Examination: General: Adult male, critically ill, lying in bed intubated requiring mechanical ventilation  NAD HEENT: MM pink/moist, anicteric, atraumatic, neck supple Neuro: RASS: -4, unable to follow commands, PERRL +2, rhythmic facial and right thigh twitching with right downward gaze deviation CV: s1s2 irregular, A-fib on monitor, no r/m/g Pulm: Regular, non labored on PRVC 100% & PEEP 8, breath sounds coarse crackles with expiratory wheezing-BUL & diminished-BLL GI: soft, rounded, bs x 4 Skin: no rashes/lesions noted Extremities: warm/dry, pulses + 2 R/P, no edema noted   Assessment and Plan   Cardiac arrest: initial rhythm PEA - s/p ROSc- MRI brain tonight Circulatory shock-RESOLED Suspected New Onset Atrial Fibrillation -RULED OUT Elevated BNP and suspected flash pulmonary edema 12 + minutes downtime, suspected anoxic injury POCUS evaluated with E-link intensivist, low suspicion for PE due to RV function. Unable to heparinize due to suspected GIB. Cr >2, will hold off on CT angio at this time - candidate for Normothermia Protocol - Continue vasopressors: levophed & vasopressin PRN, to maintain MAP > 65 - Echocardiogram ordered - f/u BLE doppler US  - consider Amiodarone bolus and infusion if RVR develops - Unable to Heparinize due suspected GIB - f/u TSH & thyroid panel - diurese PRN with lasix as renal function and hemodynamics allow - consult cardiology, appreciate input - Trend troponin, lactic, STAT labs - Cardiology consulted, appreciate input - Hold preadmission medication: hydralazine & Imdur   Acute hypoxic respiratory failure suspect secondary to flash pulmonary edema in the setting of acute encephalopathy and cardiac arrest Suspected CAP  - Ventilator settings: PRVC 8 mL/kg, 100% FiO2, 5 PEEP - Wean PEEP and FiO2 for sats greater than 90% - Plateau pressures less than 30 cm H20 - VAP bundle in place - Intermittent chest x-ray & ABG - Daily WUA/ SBT as tolerated - Ensure adequate pulmonary hygiene  - F/u cultures, trend PCT - Initiate Aspiration Pna coverage: Unasyn   Acutely comatose - post cardiac arrest - STAT CT head once stabilized -  MRI brain tonight -on propofol  - RASS goal: 0, -1 - EEG ordered - s/p Keppra, followed by 500 mg BIDx 1d - repeat CT head - Neuro consulted  Suspected Acute Kidney Injury on CKD? Worsening AGMA No history of CKD but renal function in 2020 and 2021 revealed possible CKD stage 3a Baseline Cr: most recent from 2021- 1.49, Cr on admission: 1.83 - Strict I/O's: alert provider if UOP < 0.5 mL/kg/hr - sodium bicarbonate  drip started  - f/u beta hydroxy, Daily BMP, replace electrolytes PRN - Avoid nephrotoxic agents as able, ensure adequate renal perfusion - Consider nephrology consultation if iHD or CRRT indicated  - consider renal US  if trend significantly worsens   Severe Anemia s/t Suspected Gastrointestinal Bleed -patient received 4 units prbc transfusion and then had apparent anaphylaxis.   I spoke with Odean Clare at blood bank and report has been sent to [facility] geologist.  Dr Norleen Dover is the blood bank director. They will reach out to us  for additional guidance if more testing is needed.  PMHx: PUD, GERD - continue Protonix  BID - Maintain up to date type & screen - 3rd unit of pRBC's ordered - continuous cardiac monitoring - NPO - Monitor for s/s of bleeding - Daily CBC, PT/ INR monitoring PRN - Transfuse for Hgb <7 - GI consulted, appreciate input  Transaminitis suspect Shock liver secondary to cardiac arrest Mildly Elevated Lipase without evidence of pancreatitis on CT imaging - Trend hepatic function - Consider RUQ US  PRN - avoid hepatotoxic agents - hold statin for now, consider restarting as patient stabilizes  Type 2 Diabetes Mellitus Hemoglobin A1C: pending - Monitor CBG Q 4 hours - SSI moderate dosing - target range while in ICU: 140-180 - follow ICU hyper/hypo-glycemia protocol  Suspected BPH Unable to place foley catheter due to anatomy. Urinary retention. Unable to place coude - Bladder scan Q 6 - if bladder volume reaches > 500 mL prior to day shift will consult urology urgently, otherwise urology consultation in AM for catheter placement  Labs   CBC: Recent Labs  Lab 10/17/23 1639 10/18/23 0029 10/18/23 1206 10/19/23 0356 10/19/23 0848 10/19/23 1140 10/19/23 2327 10/20/23 0453  WBC 4.7 10.7*  --  15.4*  --   --   --  12.9*  HGB 4.8* 6.0*   < > 7.9* 7.4* 7.5* 7.2* 7.2*  HCT 16.5* 21.2*   < > 24.9* 23.6* 23.5* 22.6* 23.1*  MCV 83.3 86.9  --  81.9  --   --    --  81.6  PLT 180 204  --  121*  --   --   --  114*   < > = values in this interval not displayed.    Basic Metabolic Panel: Recent Labs  Lab 10/18/23 0029 10/18/23 0431 10/18/23 1140 10/19/23 0356 10/20/23 0453  NA 140 142 144 141 144  K 4.5 4.6 4.7 5.1 3.9  CL 109 107 105 101 101  CO2 14* 18* 24 24 27   GLUCOSE 241* 208* 147* 170* 116*  BUN 30* 33* 35* 48* 57*  CREATININE 2.31* 2.21* 2.40* 2.68* 2.65*  CALCIUM  8.4* 8.5* 8.1* 7.5* 7.4*  MG 3.2* 3.0*  --   --  2.8*  PHOS 8.5* 7.3*  --  6.4* 6.8*   GFR: Estimated Creatinine Clearance: 17.6 mL/min (A) (by C-G formula based on SCr of 2.65 mg/dL (H)). Recent Labs  Lab 10/17/23 1639 10/18/23 0029 10/18/23 0431 10/18/23 0827 10/18/23 1140 10/19/23 0356 10/20/23 0453  PROCALCITON  --   --  0.82  --   --  7.07  --   WBC 4.7 10.7*  --   --   --  15.4* 12.9*  LATICACIDVEN  --  >9.0* 8.7* 6.3* 2.7*  --   --     Liver Function Tests: Recent Labs  Lab 10/17/23 1639 10/18/23 0029 10/18/23 0431 10/19/23 0356 10/20/23 0453  AST 32 125* 146* 103*  --   ALT 17 110* 116* 90*  --   ALKPHOS 56 56 60 44  --   BILITOT 0.4 0.5 0.7 0.6  --   PROT 6.5 5.9* 6.2* 5.7*  --   ALBUMIN 3.6 3.1* 3.3* 3.0*  3.0* 2.9*   Recent Labs  Lab 10/17/23 1639 10/18/23 0431  LIPASE 55* 53*   Recent Labs  Lab 10/18/23 0431  AMMONIA <13    ABG    Component Value Date/Time   PHART 7.36 10/19/2023 0526   PCO2ART 47 10/19/2023 0526   PO2ART 276 (H) 10/19/2023 0526   HCO3 26.6 10/19/2023 0526   ACIDBASEDEF 3.1 (H) 10/18/2023 0840   O2SAT 99.4 10/19/2023 0526     Coagulation Profile: Recent Labs  Lab 10/18/23 0029 10/19/23 0356  INR 1.4* 1.3*    Cardiac Enzymes: No results for input(s): CKTOTAL, CKMB, CKMBINDEX, TROPONINI in the last 168 hours.  HbA1C: Hgb A1c MFr Bld  Date/Time Value Ref Range Status  10/19/2023 03:56 AM 6.5 (H) 4.8 - 5.6 % Final    Comment:    (NOTE) Diagnosis of Diabetes The following HbA1c  ranges recommended by the American Diabetes Association (ADA) may be used as an aid in the diagnosis of diabetes mellitus.  Hemoglobin             Suggested A1C NGSP%              Diagnosis  <5.7                   Non Diabetic  5.7-6.4                Pre-Diabetic  >6.4                   Diabetic  <7.0                   Glycemic control for                       adults with diabetes.      CBG: Recent Labs  Lab 10/19/23 1142 10/19/23 1527 10/19/23 2016 10/19/23 2329 10/20/23 0453  GLUCAP 131* 99 107* 103* 126*    Review of Systems:   UTA- patient unresponsive on mechanical vent support  Past Medical History:  He,  has a past medical history of Anemia, Diabetes mellitus without complication (HCC), GERD (gastroesophageal reflux disease), Hypercholesteremia, Hypertension, Peripheral vascular disease, and Thrombocytopenia.   Surgical History:   Past Surgical History:  Procedure Laterality Date   COLONOSCOPY     COLONOSCOPY WITH PROPOFOL  N/A 11/08/2015   Procedure: COLONOSCOPY WITH PROPOFOL ;  Surgeon: Rogelia Copping, MD;  Location: [facility] ENDOSCOPY;  Service: Endoscopy;  Laterality: N/A;   LEFT HEART CATH AND CORONARY ANGIOGRAPHY Left 12/03/2017   Procedure: LEFT HEART CATH AND CORONARY ANGIOGRAPHY;  Surgeon: Bosie Vinie LABOR, MD;  Location: [facility] INVASIVE CV LAB;  Service: Cardiovascular;  Laterality: Left;   PERIPHERAL VASCULAR CATHETERIZATION  11/30/2014   Procedure: Lower Extremity Intervention;  Surgeon: Cordella KANDICE Shawl, MD;  Location[facility] INVASIVE  CV LAB;  Service: Cardiovascular;;   PERIPHERAL VASCULAR CATHETERIZATION N/A 11/30/2014   Procedure: Abdominal Aortogram w/Lower Extremity;  Surgeon: Cordella KANDICE Shawl, MD;  Location: [facility] INVASIVE CV LAB;  Service: Cardiovascular;  Laterality: N/A;   rt hand trama, tips of two fingers removed       Social History:   reports that he quit smoking about 9 years ago. His smoking use included cigarettes. He started smoking about  49 years ago. He has a 60 pack-year smoking history. He has never used smokeless tobacco. He reports that he does not drink alcohol and does not use drugs.   Family History:  His family history includes Dementia in his brother.   Allergies No Known Allergies   Home Medications  Prior to Admission medications   Medication Sig Start Date End Date Taking? Authorizing Provider  atorvastatin  (LIPITOR) 10 MG tablet Take 10 mg by mouth every evening.    [provider]  atorvastatin  (LIPITOR) 20 MG tablet Take 20 mg by mouth every evening. 12/18/16   [provider]  ferrous fumarate -iron  polysaccharide complex (TANDEM) 162-115.2 MG CAPS capsule Take 1 capsule by mouth daily with breakfast. 10/28/15   Brahmanday, Govinda R, MD  hydrALAZINE  (APRESOLINE ) 10 MG tablet Take 10 mg by mouth daily.  06/09/15   [provider]  isosorbide  mononitrate (IMDUR ) 30 MG 24 hr tablet Take 30 mg by mouth daily. 07/28/15   [provider]  omeprazole (PRILOSEC) 40 MG capsule Take 40 mg by mouth daily. Reported on 06/16/2015    [provider]  pantoprazole  (PROTONIX ) 40 MG tablet Take 1 tablet (40 mg total) by mouth daily. 02/16/18 02/16/19  Dorothyann Drivers, MD  tamsulosin  (FLOMAX ) 0.4 MG CAPS capsule Take 0.4 mg by mouth every evening. 05/29/15   [provider]  traMADol  (ULTRAM ) 50 MG tablet Take 0.5 tablets (25 mg total) by mouth every 6 (six) hours as needed. 02/16/18   Dorothyann Drivers, MD     Critical care provider statement:   Total critical care time: 96 minutes   Performed by: Parris MD   Critical care time was exclusive of separately billable procedures and treating other patients.   Critical care was necessary to treat or prevent imminent or life-threatening deterioration.   Critical care was time spent personally by me on the following activities: development of treatment plan with patient and/or surrogate as well as nursing, discussions with  consultants, evaluation of patient's response to treatment, examination of patient, obtaining history from patient or surrogate, ordering and performing treatments and interventions, ordering and review of laboratory studies, ordering and review of radiographic studies, pulse oximetry and re-evaluation of patient's condition.    Alma Mohiuddin, M.D.  Pulmonary & Critical Care Medicine

## 2023-10-20 NOTE — Plan of Care (Signed)
   Problem: Safety: Goal: Ability to remain free from injury will improve Outcome: Progressing

## 2023-10-20 NOTE — Procedures (Signed)
 PROCEDURE: BRONCHOSCOPY Therapeutic Aspiration of Tracheobronchial Tree and Bronchoalveolar lavage  PROCEDURE DATE: 10/20/2023  TIME:  NAME:  Scott Hudson  DOB:1929/02/11  MRN: [id number] LOC:  IC07A/IC07A-AA    HOSP DAY: @LENGTHOFSTAYDAYS @ CODE STATUS:      Code Status Orders  (From admission, onward)           Start     Ordered   10/17/23 2111  Full code  Continuous       Question:  By:  Answer:  Consent: discussion documented in EHR   10/17/23 2112           Code Status History     Date Active Date Inactive Code Status Order ID Comments User Context   04/28/2018 1613 04/29/2018 1736 Full Code [id number]  Lonzo Pollen, MD Inpatient   12/03/2017 1100 12/03/2017 1623 Full Code [id number]  Bosie Vinie LABOR, MD Inpatient   11/30/2014 1236 11/30/2014 1555 Full Code [id number]  Schnier, Cordella MATSU, MD Inpatient           Indications/Preliminary Diagnosis:  HEMOPTYSIS WITH POSSIBLE ALVEOLAR HEMORRHAGE  Consent: (Place X beside choice/s below)  The benefits, risks and possible complications of the procedure were        explained to:  ___ patient  __x_ patient's family  ___ other:___________  who verbalized understanding and gave:  ___ verbal  __x_ written  ___ verbal and written  ___ telephone  ___ other:________ consent.      Unable to obtain consent; procedure performed on emergent basis.     Other:       PRESEDATION ASSESSMENT: History and Physical has been performed. Patient meds and allergies have been reviewed. Presedation airway examination has been performed and documented. Baseline vital signs, sedation score, oxygenation status, and cardiac rhythm were reviewed. Patient was deemed to be in satisfactory condition to undergo the procedure.      PROCEDURE DETAILS: Timeout performed and correct patient, name, & ID confirmed. Following prep per Pulmonary policy, appropriate sedation was administered. The Bronchoscope was inserted in to oral cavity with  bite block in place. Therapeutic aspiration of Tracheobronchial tree was performed.  Airway exam proceeded with findings, technical procedures, and specimen collection as noted below. At the end of exam the scope was withdrawn without incident. Impression and Plan as noted below.      Insertion Route (Place X beside choice below)   Nasal   Oral  X Endotracheal Tube   Tracheostomy   INTRAPROCEDURE MEDICATIONS:  Sedative/Narcotic Amt Dose   Versed   mg   Fentanyl   mcg  Diprivan  GTT mg       Medication Amt Dose  Medication Amt Dose  Lidocaine  1%  cc  Epinephrine 1:10,000 sol  cc  Xylocaine  4%  cc  Cocaine  cc   TECHNICAL PROCEDURES: (Place X beside choice below)   Procedures  Description    None     Electrocautery     Cryotherapy     Balloon Dilatation     Bronchography     Stent Placement   X  Therapeutic Aspiration BILATERAL    Laser/Argon Plasma    Brachytherapy Catheter Placement    Foreign Body Removal         SPECIMENS (Sites): (Place X beside choice below)  Specimens Description   No Specimens Obtained     Washings   X Lavage RIGHT MIDDLE LOBE   Biopsies    Fine Needle Aspirates    Brushings    Sputum  FINDINGS:     THICK PHLEGM WITH BLOOD TINGED MUCUS AND CLOTS ASPIRATED BILATERALLY.  ABSENCE OF DAH  RML LAVAGE PERFORMED FOR MICROBIOLOGY WITH GM STAIN AND CULTURE  ESTIMATED BLOOD LOSS: none COMPLICATIONS/RESOLUTION: none    RECOMMENDATION/PLAN:   AWAIT CULTURES.  CONTINUE WORKING TO WEAN OFF SEDATION AND LIBERATE FROM MV.    Demarrius Guerrero, M.D.  Pulmonary & Critical Care Medicine  [facility]

## 2023-10-21 ENCOUNTER — Inpatient Hospital Stay

## 2023-10-21 DIAGNOSIS — J810 Acute pulmonary edema: Secondary | ICD-10-CM

## 2023-10-21 DIAGNOSIS — E119 Type 2 diabetes mellitus without complications: Secondary | ICD-10-CM

## 2023-10-21 DIAGNOSIS — R7401 Elevation of levels of liver transaminase levels: Secondary | ICD-10-CM

## 2023-10-21 DIAGNOSIS — I739 Peripheral vascular disease, unspecified: Secondary | ICD-10-CM

## 2023-10-21 DIAGNOSIS — K7200 Acute and subacute hepatic failure without coma: Secondary | ICD-10-CM

## 2023-10-21 DIAGNOSIS — J9601 Acute respiratory failure with hypoxia: Secondary | ICD-10-CM

## 2023-10-21 DIAGNOSIS — N179 Acute kidney failure, unspecified: Secondary | ICD-10-CM

## 2023-10-21 LAB — RENAL FUNCTION PANEL
Albumin: 2.8 g/dL — ABNORMAL LOW (ref 3.5–5.0)
Anion gap: 11 (ref 5–15)
BUN: 56 mg/dL — ABNORMAL HIGH (ref 8–23)
CO2: 30 mmol/L (ref 22–32)
Calcium: 7.5 mg/dL — ABNORMAL LOW (ref 8.9–10.3)
Chloride: 106 mmol/L (ref 98–111)
Creatinine, Ser: 2.25 mg/dL — ABNORMAL HIGH (ref 0.61–1.24)
GFR, Estimated: 27 mL/min — ABNORMAL LOW (ref >60)
Glucose, Bld: 88 mg/dL (ref 70–99)
Phosphorus: 4.4 mg/dL (ref 2.5–4.6)
Potassium: 3.5 mmol/L (ref 3.5–5.1)
Sodium: 147 mmol/L — ABNORMAL HIGH (ref 135–145)

## 2023-10-21 LAB — CBC
HCT: 24.9 % — ABNORMAL LOW (ref 39.0–52.0)
Hemoglobin: 7.6 g/dL — ABNORMAL LOW (ref 13.0–17.0)
MCH: 25.5 pg — ABNORMAL LOW (ref 26.0–34.0)
MCHC: 30.5 g/dL (ref 30.0–36.0)
MCV: 83.6 fL (ref 80.0–100.0)
Platelets: 104 K/uL — ABNORMAL LOW (ref 150–400)
RBC: 2.98 MIL/uL — ABNORMAL LOW (ref 4.22–5.81)
RDW: 17.6 % — ABNORMAL HIGH (ref 11.5–15.5)
WBC: 10.3 K/uL (ref 4.0–10.5)
nRBC: 0.8 % — ABNORMAL HIGH (ref 0.0–0.2)

## 2023-10-21 LAB — GLUCOSE, CAPILLARY
Glucose-Capillary: 80 mg/dL (ref 70–99)
Glucose-Capillary: 90 mg/dL (ref 70–99)
Glucose-Capillary: 93 mg/dL (ref 70–99)
Glucose-Capillary: 82 mg/dL (ref 70–99)
Glucose-Capillary: 81 mg/dL (ref 70–99)
Glucose-Capillary: 82 mg/dL (ref 70–99)
Glucose-Capillary: 96 mg/dL (ref 70–99)

## 2023-10-21 LAB — HEMOGLOBIN AND HEMATOCRIT, BLOOD
HCT: 24.2 % — ABNORMAL LOW (ref 39.0–52.0)
HCT: 25.2 % — ABNORMAL LOW (ref 39.0–52.0)
HCT: 25.4 % — ABNORMAL LOW (ref 39.0–52.0)
Hemoglobin: 7.2 g/dL — ABNORMAL LOW (ref 13.0–17.0)
Hemoglobin: 7.5 g/dL — ABNORMAL LOW (ref 13.0–17.0)
Hemoglobin: 7.9 g/dL — ABNORMAL LOW (ref 13.0–17.0)

## 2023-10-21 MED ORDER — NICARDIPINE HCL IN NACL 20-0.86 MG/200ML-% IV SOLN
3.0000 mg/h | INTRAVENOUS | Status: DC
  Administered 2023-10-21: 5 mg/h via INTRAVENOUS
  Filled 2023-10-21 (×2): qty 200

## 2023-10-21 MED ORDER — FREE WATER
100.0000 mL | Status: DC
  Administered 2023-10-21 – 2023-10-22 (×5): 100 mL

## 2023-10-21 MED ORDER — HEPARIN SODIUM (PORCINE) 5000 UNIT/ML IJ SOLN
5000.0000 [IU] | Freq: Two times a day (BID) | INTRAMUSCULAR | Status: DC
  Administered 2023-10-21 – 2023-10-22 (×3): 5000 [IU] via SUBCUTANEOUS
  Filled 2023-10-21 (×3): qty 1

## 2023-10-21 MED ORDER — AMLODIPINE BESYLATE 5 MG PO TABS
5.0000 mg | ORAL_TABLET | Freq: Every day | ORAL | Status: DC
  Administered 2023-10-21 – 2023-10-22 (×3): 5 mg
  Filled 2023-10-21 (×2): qty 1

## 2023-10-21 MED ORDER — POTASSIUM CHLORIDE 10 MEQ/100ML IV SOLN
10.0000 meq | INTRAVENOUS | Status: AC
  Administered 2023-10-21 (×2): 10 meq via INTRAVENOUS
  Filled 2023-10-21 (×2): qty 100

## 2023-10-21 MED ORDER — ACETAMINOPHEN 325 MG PO TABS
650.0000 mg | ORAL_TABLET | Freq: Four times a day (QID) | ORAL | Status: DC | PRN
  Administered 2023-10-21: 650 mg
  Filled 2023-10-21: qty 2

## 2023-10-21 MED ORDER — THIAMINE HCL 100 MG PO TABS
100.0000 mg | ORAL_TABLET | Freq: Every day | ORAL | Status: DC
  Administered 2023-10-22: 100 mg
  Filled 2023-10-21: qty 1

## 2023-10-21 MED ORDER — HYDRALAZINE HCL 10 MG PO TABS
10.0000 mg | ORAL_TABLET | Freq: Two times a day (BID) | ORAL | Status: DC
  Administered 2023-10-21 – 2023-10-22 (×3): 10 mg
  Filled 2023-10-21 (×3): qty 1

## 2023-10-21 MED ORDER — VITAL AF 1.2 CAL PO LIQD
1000.0000 mL | ORAL | Status: DC
Start: 2023-10-21 — End: 2023-10-22
  Administered 2023-10-21: 1000 mL

## 2023-10-21 MED ORDER — HYDRALAZINE HCL 20 MG/ML IJ SOLN
10.0000 mg | Freq: Four times a day (QID) | INTRAMUSCULAR | Status: DC | PRN
  Administered 2023-10-21: 10 mg via INTRAVENOUS
  Filled 2023-10-21 (×2): qty 1

## 2023-10-21 NOTE — Progress Notes (Signed)
   10/21/23 1000  Spiritual Encounters  Type of Visit Follow up  Care provided to: Patient (Patient is intubated and not available)  Referral source Chaplain team  Reason for visit Code  OnCall Visit No

## 2023-10-21 NOTE — Plan of Care (Signed)
  Problem: Education: Goal: Ability to describe self-care measures that may prevent or decrease complications (Diabetes Survival Skills Education) will improve Outcome: Progressing Goal: Individualized Educational Video(s) Outcome: Progressing   Problem: Coping: Goal: Ability to adjust to condition or change in health will improve Outcome: Progressing   Problem: Fluid Volume: Goal: Ability to maintain a balanced intake and output will improve Outcome: Progressing   Problem: Health Behavior/Discharge Planning: Goal: Ability to identify and utilize available resources and services will improve Outcome: Progressing Goal: Ability to manage health-related needs will improve Outcome: Progressing   Problem: Metabolic: Goal: Ability to maintain appropriate glucose levels will improve Outcome: Progressing   Problem: Nutritional: Goal: Maintenance of adequate nutrition will improve Outcome: Progressing Goal: Progress toward achieving an optimal weight will improve Outcome: Progressing   Problem: Skin Integrity: Goal: Risk for impaired skin integrity will decrease Outcome: Progressing   Problem: Tissue Perfusion: Goal: Adequacy of tissue perfusion will improve Outcome: Progressing   Problem: Education: Goal: Knowledge of General Education information will improve Description: Including pain rating scale, medication(s)/side effects and non-pharmacologic comfort measures Outcome: Progressing   Problem: Health Behavior/Discharge Planning: Goal: Ability to manage health-related needs will improve Outcome: Progressing   Problem: Clinical Measurements: Goal: Ability to maintain clinical measurements within normal limits will improve Outcome: Progressing Goal: Will remain free from infection Outcome: Progressing Goal: Diagnostic test results will improve Outcome: Progressing Goal: Respiratory complications will improve Outcome: Progressing Goal: Cardiovascular complication will  be avoided Outcome: Progressing   Problem: Activity: Goal: Risk for activity intolerance will decrease Outcome: Progressing   Problem: Nutrition: Goal: Adequate nutrition will be maintained Outcome: Progressing   Problem: Coping: Goal: Level of anxiety will decrease Outcome: Progressing   Problem: Elimination: Goal: Will not experience complications related to bowel motility Outcome: Progressing Goal: Will not experience complications related to urinary retention Outcome: Progressing   Problem: Pain Managment: Goal: General experience of comfort will improve and/or be controlled Outcome: Progressing   Problem: Safety: Goal: Ability to remain free from injury will improve Outcome: Progressing   Problem: Skin Integrity: Goal: Risk for impaired skin integrity will decrease Outcome: Progressing   Problem: Activity: Goal: Ability to tolerate increased activity will improve Outcome: Progressing   Problem: Respiratory: Goal: Ability to maintain a clear airway and adequate ventilation will improve Outcome: Progressing   Problem: Role Relationship: Goal: Method of communication will improve Outcome: Progressing

## 2023-10-21 NOTE — Progress Notes (Addendum)
 Nutrition Follow Up Note   DOCUMENTATION CODES:   Non-severe (moderate) malnutrition in context of social or environmental circumstances  INTERVENTION:   Once OGT in good position:  Vital 1.2@60ml /hr- Initiate at 82ml/hr and increase by 10ml/hr q 8 hours until goal rate is reached.   Free water flushes q4 hours   Regimen provides 1728kcal/day, 108g/day protein and 1767ml/day of free water.   Thiamine 100mg  daily via tube x 7 days   Pt at high refeed risk; recommend monitor potassium, magnesium and phosphorus labs daily until stable  Daily weights   NUTRITION DIAGNOSIS:   Moderate Malnutrition related to social / environmental circumstances as evidenced by moderate fat depletion, moderate muscle depletion.  GOAL:   Provide needs based on ASPEN/SCCM guidelines -not met   MONITOR:   Vent status, Labs, Weight trends, Skin, I & O's, TF tolerance  ASSESSMENT:   89 y/o male with h/o IDA, diverticulosis, internal hemorrhoids, DM, HLD, PAD, GERD, HTN and PUD who is admitted with PEA arrest, anemia, new A-fib, suspected apiration, ARDS, seizures and AKI.  Pt remains ventilated. OGT in place but needs advancement; RN has advanced and KUB is pending. Tube feeds have been held over the weekend. Plan is to initiate tube feeds today. Pt is at high refeed risk. Pt with mild hypernatremia today; RD will add free water. Per chart, pt appears to be around his UBW currently. No BM yet. MD having ongoing discussions about GOC with family.    Medications reviewed and include: heparin , insulin, protonix , doxycycline, zosyn, KCl  Labs reviewed: Na 147(H), K 3.5 wnl, BUN 56(H), creat 2.25(H), P 4.4 wnl Mg 2.8(H)- 10/12 Iron  19(L), TIBC 396, ferritin 21(L), folate > 20, B12 645 wnl- 10/11 Hgb 7.5(L), Hct 25.2(L) Cbgs- 82, 93, 90, 80 x 24 hrs   Patient is currently intubated on ventilator support MV: 6.9 L/min Temp (24hrs), Avg:98.6 F (37 C), Min:97.2 F (36.2 C), Max:99.1 F (37.3  C)  MAP- >66mmHg   UOP-   Diet Order:   Diet Order             Diet NPO time specified  Diet effective now                  EDUCATION NEEDS:   No education needs have been identified at this time  Skin:  Skin Assessment: Reviewed RN Assessment  Last BM:  pta  Height:   Ht Readings from Last 1 Encounters:  10/18/23 5' 8 (1.727 m)    Weight:   Wt Readings from Last 1 Encounters:  10/21/23 74.5 kg    Ideal Body Weight:  70 kg  BMI:  Body mass index is 24.97 kg/m.  Estimated Nutritional Needs:   Kcal:  1765kcal/day  Protein:  105-120g/day  Fluid:  1.8-2.1L/day  Augustin Shams MS, RD, LDN If unable to be reached, please send secure chat to RD inpatient available from 8:00a-4:00p daily

## 2023-10-21 NOTE — Progress Notes (Signed)
 Date and time results received: 10/21/23 1140  Test: K Critical Value: 2.7  Name of Provider Notified: Dr. LOIS November  Orders Received? Or Actions Taken?: See MAR

## 2023-10-21 NOTE — Progress Notes (Signed)
 PT Cancellation Note  Patient Details Name: [name] MRN: [id number] DOB: 09-Dec-1929   Cancelled Treatment:    Reason Eval/Treat Not Completed: Medical issues which prohibited therapy (Patient remains sedated with RASS -4 with goal to wean down propofol  to assess mental status. Discussed with nurse, not appropriate for PT today. Will follow up as appropriate.)  Randine Essex, PT, MPT  Randine LULLA Essex 10/21/2023, 12:28 PM

## 2023-10-21 NOTE — Progress Notes (Signed)
 PHARMACY CONSULT NOTE - ELECTROLYTES  Pharmacy Consult for Electrolyte Monitoring and Replacement   Recent Labs: Height: 5' 8 (172.7 cm) Weight: 74.5 kg (164 lb 3.9 oz) IBW/kg (Calculated) : 68.4 Estimated Creatinine Clearance: 20.7 mL/min (A) (by C-G formula based on SCr of 2.25 mg/dL (H)). Potassium (mmol/L)  Date Value  10/21/2023 3.5  07/26/2011 4.5   Magnesium (mg/dL)  Date Value  89/87/7974 2.8 (H)   Calcium  (mg/dL)  Date Value  89/86/7974 7.5 (L)   Calcium , Total (mg/dL)  Date Value  92/81/7986 9.0   Albumin (g/dL)  Date Value  89/86/7974 2.8 (L)  07/26/2011 3.4   Phosphorus (mg/dL)  Date Value  89/86/7974 4.4   Sodium (mmol/L)  Date Value  10/21/2023 147 (H)  07/26/2011 141   Assessment  Scott Hudson is a 89 y.o. male presented with symptomatic anemia and complaints of SOB, chest pain with exertion, and generalized weakness. PMH significant for HTN, DM, PAD, anemia, GERD. 10/9, suffered a PEA arrest and was transferred to the ICU. Pharmacy has been consulted to monitor and replace electrolytes.  Diet: NPO, on ventilator w/ OG tube  Goal of Therapy: Electrolytes WNL  Plan:  10 mEq IV KCl x 2 Renal function panel is ordered for tomorrow AM  Thank you for allowing pharmacy to be a part of this patient's care.  Scott Hudson 10/21/2023 7:08 AM

## 2023-10-21 NOTE — IPAL (Signed)
  Interdisciplinary Goals of Care Family Meeting   Date carried out: 10/21/2023  Location of the meeting: Conference room  Member's involved: Physician and Family Member or next of kin  Durable Power of Attorney or acting medical decision maker: Family members (two sons, daughter, and two daughters in Social worker).    Discussion: We discussed goals of care for Scott Hudson .  Discussed patient's condition with his family members, including recent course of cardiac arrest, respiratory failure with vent requirement, and AKI. Explained, and family understands, gravity of situation and critical nature of illness. Family expressed that patient would not want to be kept alive artificially, but would want to await and see if there is any sign of neurological recovery. We have turned off sedatives and will assess for any signs of meaningful neurological recovery. They would prefer to proceed with comfort measures in the absence of meaningful neurological recovery. They will discuss code status amongst themselves in the meantime, and I have encouraged consideration of DNR status.  Code status:   Code Status: Full Code   Disposition: Continue current acute care  Time spent for the meeting: 25 minutes    Belva November, MD  10/21/2023, 2:53 PM

## 2023-10-21 NOTE — Plan of Care (Signed)
  Problem: Fluid Volume: Goal: Ability to maintain a balanced intake and output will improve Outcome: Progressing   Problem: Metabolic: Goal: Ability to maintain appropriate glucose levels will improve Outcome: Progressing   Problem: Skin Integrity: Goal: Risk for impaired skin integrity will decrease Outcome: Progressing   Problem: Clinical Measurements: Goal: Cardiovascular complication will be avoided Outcome: Progressing   Problem: Pain Managment: Goal: General experience of comfort will improve and/or be controlled Outcome: Progressing

## 2023-10-21 NOTE — Progress Notes (Signed)
 NAME:  ERIQUE Hudson, MRN:  [id number], DOB:  09/05/1929, LOS: 4 ADMISSION DATE:  10/17/2023, CHIEF COMPLAINT:  Cardiac Arrest   History of Present Illness:   89 yo M presenting to [facility] ED from home via EMS for evaluation of dyspnea and chest pain.   History obtained per chart review and family bedside report as patient is intubated and unresponsive at this time post cardiac arrest.  Patient was in his normal state of health until about 2 days prior to arrival (10/7) when he began experiencing shortness of breath particularly with exertion and chest discomfort intermittently. He also noticed generalized weakness. He endorsed dry cough, but denied fever/ chills. The patient on arrival also denied any signs of bleeding including no black/ tarry stools. He has also been complaining of significant lower back pain. Family confirmed no noticed fever/ chills, falls, AMS, LOC, blurred vision, signs of bleeding, abdominal pain/ nausea/ vomiting/ diarrhea, congestion and he has not been around anyone sick.  History of tobacco use, no ETOH/ recreational drug use.    ED course: Upon arrival, patient alert and responsive, afebrile with stable vitals on room air. Labs significant for severe anemia, elevated lipase, elevated BNP and elevated but flat troponin. Imaging showed pulmonary edema, pleural effusion and possible pneumonia. Patient guaiac positive but no frank bleeding. TRH consulted for admission.  Medications given: 40 mg of Lasix, 1 unit pRBC's (2nd ordered), protonix , azithromycin &  Initial Vitals: 97.8, 20, 86, 114/48 & 90% on RA   After admission to the medical floor, patient was sitting on the edge of the bed and talking to his son. The patient became lethargic dropping down to the bed and staring off. Staff was called who found the patient unresponsive with agonal breathing and called a Rapid response. Subsequently, the patient became pulseless and CODE BLUE team was activated. Initial rhythm  was PEA. ACLS protocol initiated and ROSC achieved after about 12 minutes of  suspected downtime. Patient was emergently intubated during the event.   PCCM consulted for assistance in management and monitoring due to PEA arrest s/t unclear etiology, acute hypoxic respiratory failure requiring intubation and mechanical ventilatory support in the setting of severe anemia, suspected GIB, flash pulmonary edema and suspected new onset CHF and worsening AGMA.   Pertinent  Medical History  HTN HLD PVD Anemia GERD T2DM Thrombocytopenia Former smoker (60 total pack years)  Significant Hospital Events: Including procedures, antibiotic start and stop dates in addition to other pertinent events   10/9: Admit to medical surgical unit with severe anemia s/t suspected GIB. Overnight PEA arrest s/t unclear etiology, acute hypoxic respiratory failure requiring intubation and mechanical ventilatory support in the setting of flash pulmonary edema and suspected new onset CHF and worsening AGMA.  10/11: patient weaned down to 65% fio2 but not having adequate neurological recovery. I suspect this may be due to metabolic encephalopathy with ongoing renal impairment. We will keep off sedation for now and monitor closely. I met with family at bedside and we reviewed medical plan and answered questions.  10/12: patient had desaturation event overnight.  S/p CT chest with findings consistent with ARDS from TRALI.  Report sent to blood bank medical director. He is diuresing today with lasix challenge low dose producing adequate urine. I met with family to reivew medical plan.  Patient weaned on Fio2 from 80 to 60%.  His bronchoscopy showed NO alveolar hemorrhage.   Interim History / Subjective:  Remains intubated  Objective    Blood  pressure (!) 123/57, pulse (!) 57, temperature 98.6 F (37 C), resp. rate 15, height 5' 8 (1.727 m), weight 74.5 kg, SpO2 95%.    Vent Mode: PRVC FiO2 (%):  [40 %-70 %] 60 % Set  Rate:  [14 bmp] 14 bmp Vt Set:  [450 mL] 450 mL PEEP:  [5 cmH20-8 cmH20] 8 cmH20 Plateau Pressure:  [14 cmH20-18 cmH20] 14 cmH20   Intake/Output Summary (Last 24 hours) at 10/21/2023 0820 Last data filed at 10/21/2023 9387 Gross per 24 hour  Intake 952.64 ml  Output 2500 ml  Net -1547.36 ml   Filed Weights   10/19/23 0432 10/20/23 0500 10/21/23 0500  Weight: 74.1 kg 77.4 kg 74.5 kg    Examination: Physical Exam Constitutional:      General: He is not in acute distress.    Appearance: He is ill-appearing.  Cardiovascular:     Rate and Rhythm: Normal rate and regular rhythm.     Pulses: Normal pulses.     Heart sounds: Normal heart sounds.  Pulmonary:     Comments: Ventilated breath sounds bilaterally Neurological:     Mental Status: He is disoriented.     Comments: Unresponsive, does not follow commands      Assessment and Plan   #Cardiac Arrest #Circulatory Shock #Acute Hypoxic Respiratory Failure #Acute Pulmonary Edema #AKI on CKD  #Hypernatremia #Severe Anemia #Transaminitis  #Shock Liver #T2DM  Neuro - not following commands this AM. EEG with diffuse slowing but no seizure activity. Propofol  and fentanyl  ordered for analgosedation, will wean down propofol  to assess mental status. CV - witnessed cardiac arrest in the hospital, with 12 minutes of CPR/Downtime. Observed rhythm was PEA. TTE with mildly reduced LV function and with normal RV function. Normal blood pressure and not requiring vasopressor support. Pulm - acute hypoxic respiratory failure post cardiac arrest with considerations of TACO/TRALI, flash pulmonary edema, aspiration pneumonia, and pulmonary hemorrhage. This was also in the setting of cardiac arrest. Patient currently intubated, and did develop severe hypoxia require switch to APRV on 10/10, now with improved ventilator settings compared to a few days ago. Continues to have secretions, will work on SBT once ventilator settings are improved. ANCA  profile pending, and bronchoscopy yesterday reported without signs of DAH. GI - PPI for SUP. Start tube feeds. Renal - AKI on likely CKD. Kidney function mildly improved today, with associated hypernatremia. Continue to avoid nephrotoxins as able. Endo - ICU glycemic protocol. Hem/Onc - DVT prophylaxis with heparin  ID - Zosyn and Doxycycline for pneumonia coverage. Respiratory cultures from bronchoscopy performed yesterday are pending.  Other - I fear the patient has a poor prognosis of meaningful recovery given age, respiratory failure, renal dysfunction, and cardiac arrest. I will meet with family today to go over plan of care as well as goals of care.  Labs   CBC: Recent Labs  Lab 10/17/23 1639 10/18/23 0029 10/18/23 1206 10/19/23 0356 10/19/23 0848 10/20/23 0453 10/20/23 1127 10/20/23 1800 10/20/23 2341 10/21/23 0501  WBC 4.7 10.7*  --  15.4*  --  12.9*  --   --   --  10.3  HGB 4.8* 6.0*   < > 7.9*   < > 7.2* 7.3* 7.3* 7.2* 7.6*  HCT 16.5* 21.2*   < > 24.9*   < > 23.1* 22.9* 23.1* 24.2* 24.9*  MCV 83.3 86.9  --  81.9  --  81.6  --   --   --  83.6  PLT 180 204  --  121*  --  114*  --   --   --  104*   < > = values in this interval not displayed.    Basic Metabolic Panel: Recent Labs  Lab 10/18/23 0029 10/18/23 0431 10/18/23 1140 10/19/23 0356 10/20/23 0453 10/21/23 0501  NA 140 142 144 141 144 147*  K 4.5 4.6 4.7 5.1 3.9 3.5  CL 109 107 105 101 101 106  CO2 14* 18* 24 24 27 30   GLUCOSE 241* 208* 147* 170* 116* 88  BUN 30* 33* 35* 48* 57* 56*  CREATININE 2.31* 2.21* 2.40* 2.68* 2.65* 2.25*  CALCIUM  8.4* 8.5* 8.1* 7.5* 7.4* 7.5*  MG 3.2* 3.0*  --   --  2.8*  --   PHOS 8.5* 7.3*  --  6.4* 6.8* 4.4   GFR: Estimated Creatinine Clearance: 20.7 mL/min (A) (by C-G formula based on SCr of 2.25 mg/dL (H)). Recent Labs  Lab 10/18/23 0029 10/18/23 0431 10/18/23 0827 10/18/23 1140 10/19/23 0356 10/20/23 0453 10/21/23 0501  PROCALCITON  --  0.82  --   --  7.07  --    --   WBC 10.7*  --   --   --  15.4* 12.9* 10.3  LATICACIDVEN >9.0* 8.7* 6.3* 2.7*  --   --   --     Liver Function Tests: Recent Labs  Lab 10/17/23 1639 10/18/23 0029 10/18/23 0431 10/19/23 0356 10/20/23 0453 10/21/23 0501  AST 32 125* 146* 103*  --   --   ALT 17 110* 116* 90*  --   --   ALKPHOS 56 56 60 44  --   --   BILITOT 0.4 0.5 0.7 0.6  --   --   PROT 6.5 5.9* 6.2* 5.7*  --   --   ALBUMIN 3.6 3.1* 3.3* 3.0*  3.0* 2.9* 2.8*   Recent Labs  Lab 10/17/23 1639 10/18/23 0431  LIPASE 55* 53*   Recent Labs  Lab 10/18/23 0431  AMMONIA <13    ABG    Component Value Date/Time   PHART 7.42 10/20/2023 1548   PCO2ART 48 10/20/2023 1548   PO2ART 67 (L) 10/20/2023 1548   HCO3 31.1 (H) 10/20/2023 1548   ACIDBASEDEF 3.1 (H) 10/18/2023 0840   O2SAT 94.2 10/20/2023 1548     Coagulation Profile: Recent Labs  Lab 10/18/23 0029 10/19/23 0356  INR 1.4* 1.3*    Cardiac Enzymes: No results for input(s): CKTOTAL, CKMB, CKMBINDEX, TROPONINI in the last 168 hours.  HbA1C: Hgb A1c MFr Bld  Date/Time Value Ref Range Status  10/19/2023 03:56 AM 6.5 (H) 4.8 - 5.6 % Final    Comment:    (NOTE) Diagnosis of Diabetes The following HbA1c ranges recommended by the American Diabetes Association (ADA) may be used as an aid in the diagnosis of diabetes mellitus.  Hemoglobin             Suggested A1C NGSP%              Diagnosis  <5.7                   Non Diabetic  5.7-6.4                Pre-Diabetic  >6.4                   Diabetic  <7.0                   Glycemic control for  adults with diabetes.      CBG: Recent Labs  Lab 10/20/23 1614 10/20/23 1931 10/20/23 2338 10/21/23 0317 10/21/23 0731  GLUCAP 102* 83 88 80 90    Review of Systems:   N/A  Past Medical History:  He,  has a past medical history of Anemia, Diabetes mellitus without complication (HCC), GERD (gastroesophageal reflux disease), Hypercholesteremia,  Hypertension, Peripheral vascular disease, and Thrombocytopenia.   Surgical History:   Past Surgical History:  Procedure Laterality Date   COLONOSCOPY     COLONOSCOPY WITH PROPOFOL  N/A 11/08/2015   Procedure: COLONOSCOPY WITH PROPOFOL ;  Surgeon: Rogelia Copping, MD;  Location: [facility] ENDOSCOPY;  Service: Endoscopy;  Laterality: N/A;   LEFT HEART CATH AND CORONARY ANGIOGRAPHY Left 12/03/2017   Procedure: LEFT HEART CATH AND CORONARY ANGIOGRAPHY;  Surgeon: Bosie Vinie LABOR, MD;  Location: [facility] INVASIVE CV LAB;  Service: Cardiovascular;  Laterality: Left;   PERIPHERAL VASCULAR CATHETERIZATION  11/30/2014   Procedure: Lower Extremity Intervention;  Surgeon: Cordella KANDICE Shawl, MD;  Location: [facility] INVASIVE CV LAB;  Service: Cardiovascular;;   PERIPHERAL VASCULAR CATHETERIZATION N/A 11/30/2014   Procedure: Abdominal Aortogram w/Lower Extremity;  Surgeon: Cordella KANDICE Shawl, MD;  Location: [facility] INVASIVE CV LAB;  Service: Cardiovascular;  Laterality: N/A;   rt hand trama, tips of two fingers removed       Social History:   reports that he quit smoking about 9 years ago. His smoking use included cigarettes. He started smoking about 49 years ago. He has a 60 pack-year smoking history. He has never used smokeless tobacco. He reports that he does not drink alcohol and does not use drugs.   Family History:  His family history includes Dementia in his brother.   Allergies Allergies  Allergen Reactions   Red Blood Cells Shortness Of Breath    Suspected TRALI reaction   Packed Cells      Home Medications  Prior to Admission medications   Medication Sig Start Date End Date Taking? Authorizing Provider  amLODipine (NORVASC) 5 MG tablet Take 5 mg by mouth daily.   Yes [provider]  atorvastatin  (LIPITOR) 10 MG tablet Take 10 mg by mouth every evening.   Yes [provider]  hydrALAZINE  (APRESOLINE ) 10 MG tablet Take 10 mg by mouth daily.  06/09/15  Yes [provider]   isosorbide  mononitrate (IMDUR ) 30 MG 24 hr tablet Take 30 mg by mouth daily. 07/28/15  Yes [provider]  pantoprazole  (PROTONIX ) 40 MG tablet Take 1 tablet (40 mg total) by mouth daily. 02/16/18 10/18/23 Yes Dorothyann Drivers, MD  tamsulosin  (FLOMAX ) 0.4 MG CAPS capsule Take 0.4 mg by mouth every evening. 05/29/15  Yes [provider]  atorvastatin  (LIPITOR) 20 MG tablet Take 20 mg by mouth every evening. Patient not taking: Reported on 10/18/2023 12/18/16   [provider]  ferrous fumarate -iron  polysaccharide complex (TANDEM) 162-115.2 MG CAPS capsule Take 1 capsule by mouth daily with breakfast. 10/28/15   Brahmanday, Govinda R, MD  omeprazole (PRILOSEC) 40 MG capsule Take 40 mg by mouth daily. Reported on 06/16/2015 Patient not taking: Reported on 10/18/2023    [provider]  traMADol  (ULTRAM ) 50 MG tablet Take 0.5 tablets (25 mg total) by mouth every 6 (six) hours as needed. Patient not taking: Reported on 10/18/2023 02/16/18   Dorothyann Drivers, MD     The patient is critically ill due to cardiac arrest, respiratory failure, AKI.  Critical care was necessary to treat or prevent imminent or life-threatening deterioration. Critical care  time was spent by me on the following activities: development of a treatment plan with the patient and/or surrogate as well as nursing, discussions with consultants, evaluation of the patient's response to treatment, examination of the patient, obtaining a history from the patient or surrogate, ordering and performing treatments and interventions, ordering and review of laboratory studies, ordering and review of radiographic studies, review of telemetry data including pulse oximetry, re-evaluation of patient's condition and participation in multidisciplinary rounds.   I personally spent 55 minutes providing critical care not including any separately billable procedures.   Belva November, MD [facility] 10/21/2023 11:15 AM

## 2023-10-21 NOTE — Care Management Important Message (Signed)
 Important Message  Patient Details  Name: Scott Hudson MRN: [id number] Date of Birth: 1929-09-05   Important Message Given:  Yes - Medicare IM     Scott Hudson 10/21/2023, 3:35 PM

## 2023-10-21 NOTE — Progress Notes (Signed)
 Pt transported from ICU bed 7 to MRI at 2205; returned to ICU bed 7 at 2255. Test was completed with no incident. All VSS remain stable.

## 2023-10-21 NOTE — Progress Notes (Signed)
 OT Cancellation Note  Patient Details Name: Scott Hudson MRN: [id number] DOB: Sep 16, 1929   Cancelled Treatment:    Reason Eval/Treat Not Completed: Patient not medically ready. Chart reviewed, pt remains intubated and highly sedated. Per discussion with RN, pt is not appropriate for therapy. Will cont to monitor as medically stable.  Iza Preston E Taneka Espiritu 10/21/2023, 9:41 AM

## 2023-10-22 DIAGNOSIS — G931 Anoxic brain damage, not elsewhere classified: Secondary | ICD-10-CM

## 2023-10-22 LAB — CBC
HCT: 25.9 % — ABNORMAL LOW (ref 39.0–52.0)
Hemoglobin: 7.7 g/dL — ABNORMAL LOW (ref 13.0–17.0)
MCH: 25.2 pg — ABNORMAL LOW (ref 26.0–34.0)
MCHC: 29.7 g/dL — ABNORMAL LOW (ref 30.0–36.0)
MCV: 84.9 fL (ref 80.0–100.0)
Platelets: 95 K/uL — ABNORMAL LOW (ref 150–400)
RBC: 3.05 MIL/uL — ABNORMAL LOW (ref 4.22–5.81)
RDW: 17.6 % — ABNORMAL HIGH (ref 11.5–15.5)
WBC: 8.4 K/uL (ref 4.0–10.5)
nRBC: 1 % — ABNORMAL HIGH (ref 0.0–0.2)

## 2023-10-22 LAB — RENAL FUNCTION PANEL
Albumin: 2.6 g/dL — ABNORMAL LOW (ref 3.5–5.0)
Anion gap: 9 (ref 5–15)
BUN: 46 mg/dL — ABNORMAL HIGH (ref 8–23)
CO2: 28 mmol/L (ref 22–32)
Calcium: 7.8 mg/dL — ABNORMAL LOW (ref 8.9–10.3)
Chloride: 110 mmol/L (ref 98–111)
Creatinine, Ser: 1.72 mg/dL — ABNORMAL HIGH (ref 0.61–1.24)
GFR, Estimated: 37 mL/min — ABNORMAL LOW (ref >60)
Glucose, Bld: 123 mg/dL — ABNORMAL HIGH (ref 70–99)
Phosphorus: 3.4 mg/dL (ref 2.5–4.6)
Potassium: 3.5 mmol/L (ref 3.5–5.1)
Sodium: 147 mmol/L — ABNORMAL HIGH (ref 135–145)

## 2023-10-22 LAB — HEMOGLOBIN AND HEMATOCRIT, BLOOD
HCT: 25.9 % — ABNORMAL LOW (ref 39.0–52.0)
Hemoglobin: 8 g/dL — ABNORMAL LOW (ref 13.0–17.0)

## 2023-10-22 LAB — GLUCOSE, CAPILLARY
Glucose-Capillary: 108 mg/dL — ABNORMAL HIGH (ref 70–99)
Glucose-Capillary: 120 mg/dL — ABNORMAL HIGH (ref 70–99)
Glucose-Capillary: 141 mg/dL — ABNORMAL HIGH (ref 70–99)

## 2023-10-22 LAB — CULTURE, BAL-QUANTITATIVE W GRAM STAIN: Culture: <10000 — AB

## 2023-10-22 LAB — CULTURE, RESPIRATORY W GRAM STAIN: Culture: NORMAL

## 2023-10-22 LAB — ANCA PROFILE
Anti-MPO Antibodies: <0.2 U (ref 0.0–0.9)
Anti-PR3 Antibodies: <0.2 U (ref 0.0–0.9)
Atypical P-ANCA titer: <1:20 {titer}
C-ANCA: <1:20 {titer}
P-ANCA: <1:20 {titer}

## 2023-10-22 LAB — TRIGLYCERIDES: Triglycerides: 154 mg/dL — ABNORMAL HIGH (ref <150)

## 2023-10-22 LAB — MAGNESIUM: Magnesium: 3.5 mg/dL — ABNORMAL HIGH (ref 1.7–2.4)

## 2023-10-22 MED ORDER — HYDROMORPHONE HCL-NACL 50-0.9 MG/50ML-% IV SOLN
0.0000 mg/h | INTRAVENOUS | Status: DC
  Administered 2023-10-22: 1 mg/h via INTRAVENOUS
  Filled 2023-10-22: qty 50

## 2023-10-22 MED ORDER — PIPERACILLIN-TAZOBACTAM 3.375 G IVPB: 3.3750 g | Freq: Three times a day (TID) | INTRAVENOUS | Status: DC

## 2023-10-22 MED ORDER — HYDROMORPHONE BOLUS VIA INFUSION: 1.0000 mg | INTRAVENOUS | Status: DC | PRN

## 2023-10-22 MED ORDER — MIDAZOLAM HCL 2 MG/2ML IJ SOLN: 2.0000 mg | INTRAMUSCULAR | Status: DC | PRN

## 2023-10-23 LAB — CULTURE, BLOOD (ROUTINE X 2)
Culture: NO GROWTH
Culture: NO GROWTH
Special Requests: ADEQUATE
Special Requests: ADEQUATE

## 2023-11-09 NOTE — Progress Notes (Signed)
 PT Cancellation Note  Patient Details Name: Scott Hudson MRN: [id number] DOB: 23-Apr-1929   Cancelled Treatment:    Reason Eval/Treat Not Completed: Medical issues which prohibited therapy;Patient's level of consciousness (PT will sign off at this time.)  Randine Essex, PT, MPT  Randine LULLA Essex 11/20/23, 11:18 AM

## 2023-11-09 NOTE — IPAL (Signed)
  Interdisciplinary Goals of Care Family Meeting   Date carried out: 2023-10-24  Location of the meeting: Conference room  Member's involved: Physician and Family Member or next of kin  Durable Power of Attorney or acting medical decision maker: Patient's two sons and two daughters.   Discussion: We discussed goals of care for Scott Hudson.  I explained to the patient's offsprings the seriousness of the situation and the critical nature of his illness.  He has had a cardiac arrest with subsequent development of anoxic brain injury as evidenced on his most recent brain MRI.  Reference to yesterday's conversation and there wishes that he not be kept alive artificially or in a vegetative state.  They understand that this will not be a survivable hospitalization.  They would like to transition his goals of care to comfort measures only.  Code status: Comfort measures only.  DNR/DNI  Disposition: In-patient comfort care  Time spent for the meeting: 15 minutes    Belva November, MD  10/24/23, 12:35 PM

## 2023-11-09 NOTE — Progress Notes (Addendum)
 NAME:  Scott Hudson, MRN:  [id number], DOB:  August 18, 1929, LOS: 5 ADMISSION DATE:  10/17/2023, CHIEF COMPLAINT:  Cardiac Arrest   History of Present Illness:   89 yo M presenting to [facility] ED from home via EMS for evaluation of dyspnea and chest pain.   History obtained per chart review and family bedside report as patient is intubated and unresponsive at this time post cardiac arrest.  Patient was in his normal state of health until about 2 days prior to arrival (10/7) when he began experiencing shortness of breath particularly with exertion and chest discomfort intermittently. He also noticed generalized weakness. He endorsed dry cough, but denied fever/ chills. The patient on arrival also denied any signs of bleeding including no black/ tarry stools. He has also been complaining of significant lower back pain. Family confirmed no noticed fever/ chills, falls, AMS, LOC, blurred vision, signs of bleeding, abdominal pain/ nausea/ vomiting/ diarrhea, congestion and he has not been around anyone sick.  History of tobacco use, no ETOH/ recreational drug use.    ED course: Upon arrival, patient alert and responsive, afebrile with stable vitals on room air. Labs significant for severe anemia, elevated lipase, elevated BNP and elevated but flat troponin. Imaging showed pulmonary edema, pleural effusion and possible pneumonia. Patient guaiac positive but no frank bleeding. TRH consulted for admission.  Medications given: 40 mg of Lasix, 1 unit pRBC's (2nd ordered), protonix , azithromycin &  Initial Vitals: 97.8, 20, 86, 114/48 & 90% on RA   After admission to the medical floor, patient was sitting on the edge of the bed and talking to his son. The patient became lethargic dropping down to the bed and staring off. Staff was called who found the patient unresponsive with agonal breathing and called a Rapid response. Subsequently, the patient became pulseless and CODE BLUE team was activated. Initial rhythm  was PEA. ACLS protocol initiated and ROSC achieved after about 12 minutes of  suspected downtime. Patient was emergently intubated during the event.   PCCM consulted for assistance in management and monitoring due to PEA arrest s/t unclear etiology, acute hypoxic respiratory failure requiring intubation and mechanical ventilatory support in the setting of severe anemia, suspected GIB, flash pulmonary edema and suspected new onset CHF and worsening AGMA.   Pertinent  Medical History  HTN HLD PVD Anemia GERD T2DM Thrombocytopenia Former smoker (60 total pack years)  Significant Hospital Events: Including procedures, antibiotic start and stop dates in addition to other pertinent events   10/9: Admit to medical surgical unit with severe anemia s/t suspected GIB. Overnight PEA arrest s/t unclear etiology, acute hypoxic respiratory failure requiring intubation and mechanical ventilatory support in the setting of flash pulmonary edema and suspected new onset CHF and worsening AGMA.  10/11: patient weaned down to 65% fio2 but not having adequate neurological recovery. I suspect this may be due to metabolic encephalopathy with ongoing renal impairment. We will keep off sedation for now and monitor closely. I met with family at bedside and we reviewed medical plan and answered questions.  10/12: patient had desaturation event overnight.  S/p CT chest with findings consistent with ARDS from TRALI.  Report sent to blood bank medical director. He is diuresing today with lasix challenge low dose producing adequate urine. I met with family to reivew medical plan.  Patient weaned on Fio2 from 80 to 60%.  His bronchoscopy showed NO alveolar hemorrhage.  10/13: family meeting, patient unarousable despite being off sedation. MRI brain performed overnight. Nov 17, 2023:  on nicardipine gtt, unresponsive. Family called and informed of result of MRI brain.  Interim History / Subjective:  Remains intubated  Objective     Blood pressure (!) 140/56, pulse 72, temperature 99 F (37.2 C), resp. rate 20, height 5' 8 (1.727 m), weight 76.3 kg, SpO2 93%.    Vent Mode: PRVC FiO2 (%):  [40 %-80 %] 50 % Set Rate:  [14 bmp] 14 bmp Vt Set:  [450 mL] 450 mL PEEP:  [5 cmH20-10 cmH20] 8 cmH20 Pressure Support:  [8 cmH20] 8 cmH20 Plateau Pressure:  [13 cmH20-24 cmH20] 14 cmH20   Intake/Output Summary (Last 24 hours) at 2023-11-18 0741 Last data filed at 2023-11-18 0500 Gross per 24 hour  Intake 2112.23 ml  Output 1610 ml  Net 502.23 ml   Filed Weights   10/20/23 0500 10/21/23 0500 11/18/2023 0500  Weight: 77.4 kg 74.5 kg 76.3 kg    Examination: Physical Exam Constitutional:      General: He is not in acute distress.    Appearance: He is ill-appearing.  Cardiovascular:     Rate and Rhythm: Normal rate and regular rhythm.     Pulses: Normal pulses.     Heart sounds: Normal heart sounds.  Pulmonary:     Comments: Ventilated breath sounds bilaterally Neurological:     Mental Status: He is disoriented.     Comments: Unresponsive, does not follow commands      Assessment and Plan   #Anoxic Brain Injury #Cardiac Arrest #Circulatory Shock #Acute Hypoxic Respiratory Failure #Acute Pulmonary Edema #AKI on CKD  #Hypernatremia #Severe Anemia #Transaminitis  #Shock Liver #T2DM  Neuro - remains unresponsive. EEG with diffuse slowing but no seizure activity. Remains off sedation since yesterday. MRI brain overnight with signs of severe hypoperfusion injury. Will discuss findings with family today, as he is going to be in a vegetative state moving forward. CV - witnessed cardiac arrest in the hospital, with 12 minutes of CPR/Downtime. Observed rhythm was PEA. TTE with mildly reduced LV function and with normal RV function. Normal blood pressure and not requiring vasopressor support. He was hypertensive yesterday and required the initiation of nicardipine gtt to keep his BP < 160 mmHg. Pulm - acute hypoxic  respiratory failure post cardiac arrest with considerations of TACO/TRALI, flash pulmonary edema, aspiration pneumonia, and pulmonary hemorrhage. This was also in the setting of cardiac arrest. Patient currently intubated, and did develop severe hypoxia require switch to APRV on 10/10, with improved ventilator settings compared to a few days ago. Continues to have secretions, bronchoscopy yesterday reported without signs of DAH. Unfortunately, given findings of severe hypoperfusion injury on brain MRI as a result of his cardiac arrest, he will not be a candidate for SBT or ventilator wean. Family were clear that he would not want a trach/PEG. I will recommend transition of goals of care today. GI - PPI for SUP. tube feeds started. Renal - AKI on likely CKD. Kidney function further improved today. Continue to avoid nephrotoxins as able. Endo - ICU glycemic protocol. Hem/Onc - DVT prophylaxis with heparin  ID - Zosyn and Doxycycline for pneumonia coverage. Respiratory cultures from bronchoscopy are pending.  Labs   CBC: Recent Labs  Lab 10/18/23 0029 10/18/23 1206 10/19/23 0356 10/19/23 0848 10/20/23 0453 10/20/23 1127 10/21/23 0501 10/21/23 1139 10/21/23 1830 11/18/2023 0030 11-18-2023 0503  WBC 10.7*  --  15.4*  --  12.9*  --  10.3  --   --   --  8.4  HGB 6.0*   < >  7.9*   < > 7.2*   < > 7.6* 7.5* 7.9* 8.0* 7.7*  HCT 21.2*   < > 24.9*   < > 23.1*   < > 24.9* 25.2* 25.4* 25.9* 25.9*  MCV 86.9  --  81.9  --  81.6  --  83.6  --   --   --  84.9  PLT 204  --  121*  --  114*  --  104*  --   --   --  95*   < > = values in this interval not displayed.    Basic Metabolic Panel: Recent Labs  Lab 10/18/23 0029 10/18/23 0431 10/18/23 1140 10/19/23 0356 10/20/23 0453 10/21/23 0501 11/16/2023 0503  NA 140 142 144 141 144 147* 147*  K 4.5 4.6 4.7 5.1 3.9 3.5 3.5  CL 109 107 105 101 101 106 110  CO2 14* 18* 24 24 27 30 28   GLUCOSE 241* 208* 147* 170* 116* 88 123*  BUN 30* 33* 35* 48* 57* 56*  46*  CREATININE 2.31* 2.21* 2.40* 2.68* 2.65* 2.25* 1.72*  CALCIUM  8.4* 8.5* 8.1* 7.5* 7.4* 7.5* 7.8*  MG 3.2* 3.0*  --   --  2.8*  --  3.5*  PHOS 8.5* 7.3*  --  6.4* 6.8* 4.4 3.4   GFR: Estimated Creatinine Clearance: 27.1 mL/min (A) (by C-G formula based on SCr of 1.72 mg/dL (H)). Recent Labs  Lab 10/18/23 0029 10/18/23 0431 10/18/23 0827 10/18/23 1140 10/19/23 0356 10/20/23 0453 10/21/23 0501 11/16/23 0503  PROCALCITON  --  0.82  --   --  7.07  --   --   --   WBC 10.7*  --   --   --  15.4* 12.9* 10.3 8.4  LATICACIDVEN >9.0* 8.7* 6.3* 2.7*  --   --   --   --     Liver Function Tests: Recent Labs  Lab 10/17/23 1639 10/18/23 0029 10/18/23 0431 10/19/23 0356 10/20/23 0453 10/21/23 0501 2023/11/16 0503  AST 32 125* 146* 103*  --   --   --   ALT 17 110* 116* 90*  --   --   --   ALKPHOS 56 56 60 44  --   --   --   BILITOT 0.4 0.5 0.7 0.6  --   --   --   PROT 6.5 5.9* 6.2* 5.7*  --   --   --   ALBUMIN 3.6 3.1* 3.3* 3.0*  3.0* 2.9* 2.8* 2.6*   Recent Labs  Lab 10/17/23 1639 10/18/23 0431  LIPASE 55* 53*   Recent Labs  Lab 10/18/23 0431  AMMONIA <13    ABG    Component Value Date/Time   PHART 7.42 10/20/2023 1548   PCO2ART 48 10/20/2023 1548   PO2ART 67 (L) 10/20/2023 1548   HCO3 31.1 (H) 10/20/2023 1548   ACIDBASEDEF 3.1 (H) 10/18/2023 0840   O2SAT 94.2 10/20/2023 1548     Coagulation Profile: Recent Labs  Lab 10/18/23 0029 10/19/23 0356  INR 1.4* 1.3*    Cardiac Enzymes: No results for input(s): CKTOTAL, CKMB, CKMBINDEX, TROPONINI in the last 168 hours.  HbA1C: Hgb A1c MFr Bld  Date/Time Value Ref Range Status  10/19/2023 03:56 AM 6.5 (H) 4.8 - 5.6 % Final    Comment:    (NOTE) Diagnosis of Diabetes The following HbA1c ranges recommended by the American Diabetes Association (ADA) may be used as an aid in the diagnosis of diabetes mellitus.  Hemoglobin  Suggested A1C NGSP%              Diagnosis  <5.7                    Non Diabetic  5.7-6.4                Pre-Diabetic  >6.4                   Diabetic  <7.0                   Glycemic control for                       adults with diabetes.      CBG: Recent Labs  Lab 10/21/23 1628 10/21/23 1925 10/21/23 2310 06-Nov-2023 0313 November 06, 2023 0725  GLUCAP 81 82 96 108* 120*    Review of Systems:   N/A  Past Medical History:  He,  has a past medical history of Anemia, Diabetes mellitus without complication (HCC), GERD (gastroesophageal reflux disease), Hypercholesteremia, Hypertension, Peripheral vascular disease, and Thrombocytopenia.   Surgical History:   Past Surgical History:  Procedure Laterality Date   COLONOSCOPY     COLONOSCOPY WITH PROPOFOL  N/A 11/08/2015   Procedure: COLONOSCOPY WITH PROPOFOL ;  Surgeon: Rogelia Copping, MD;  Location: [facility] ENDOSCOPY;  Service: Endoscopy;  Laterality: N/A;   LEFT HEART CATH AND CORONARY ANGIOGRAPHY Left 12/03/2017   Procedure: LEFT HEART CATH AND CORONARY ANGIOGRAPHY;  Surgeon: Bosie Vinie LABOR, MD;  Location: [facility] INVASIVE CV LAB;  Service: Cardiovascular;  Laterality: Left;   PERIPHERAL VASCULAR CATHETERIZATION  11/30/2014   Procedure: Lower Extremity Intervention;  Surgeon: Cordella KANDICE Shawl, MD;  Location: [facility] INVASIVE CV LAB;  Service: Cardiovascular;;   PERIPHERAL VASCULAR CATHETERIZATION N/A 11/30/2014   Procedure: Abdominal Aortogram w/Lower Extremity;  Surgeon: Cordella KANDICE Shawl, MD;  Location: [facility] INVASIVE CV LAB;  Service: Cardiovascular;  Laterality: N/A;   rt hand trama, tips of two fingers removed       Social History:   reports that he quit smoking about 9 years ago. His smoking use included cigarettes. He started smoking about 49 years ago. He has a 60 pack-year smoking history. He has never used smokeless tobacco. He reports that he does not drink alcohol and does not use drugs.   Family History:  His family history includes Dementia in his brother.   Allergies Allergies  Allergen  Reactions   Red Blood Cells Shortness Of Breath    Suspected TRALI reaction   Packed Cells      Home Medications  Prior to Admission medications   Medication Sig Start Date End Date Taking? Authorizing Provider  amLODipine (NORVASC) 5 MG tablet Take 5 mg by mouth daily.   Yes [provider]  atorvastatin  (LIPITOR) 10 MG tablet Take 10 mg by mouth every evening.   Yes [provider]  hydrALAZINE  (APRESOLINE ) 10 MG tablet Take 10 mg by mouth daily.  06/09/15  Yes [provider]  isosorbide  mononitrate (IMDUR ) 30 MG 24 hr tablet Take 30 mg by mouth daily. 07/28/15  Yes [provider]  pantoprazole  (PROTONIX ) 40 MG tablet Take 1 tablet (40 mg total) by mouth daily. 02/16/18 10/18/23 Yes Dorothyann Drivers, MD  tamsulosin  (FLOMAX ) 0.4 MG CAPS capsule Take 0.4 mg by mouth every evening. 05/29/15  Yes [provider]  atorvastatin  (LIPITOR) 20 MG tablet Take 20 mg by mouth every evening. Patient not taking: Reported  on 10/18/2023 12/18/16   [provider]  ferrous fumarate -iron  polysaccharide complex (TANDEM) 162-115.2 MG CAPS capsule Take 1 capsule by mouth daily with breakfast. 10/28/15   Brahmanday, Govinda R, MD  omeprazole (PRILOSEC) 40 MG capsule Take 40 mg by mouth daily. Reported on 06/16/2015 Patient not taking: Reported on 10/18/2023    [provider]  traMADol  (ULTRAM ) 50 MG tablet Take 0.5 tablets (25 mg total) by mouth every 6 (six) hours as needed. Patient not taking: Reported on 10/18/2023 02/16/18   Dorothyann Drivers, MD     The patient is critically ill due to anoxic brain injury, cardiac arrest, respiratory failure.  Critical care was necessary to treat or prevent imminent or life-threatening deterioration. Critical care time was spent by me on the following activities: development of a treatment plan with the patient and/or surrogate as well as nursing, discussions with consultants, evaluation of the patient's response to  treatment, examination of the patient, obtaining a history from the patient or surrogate, ordering and performing treatments and interventions, ordering and review of laboratory studies, ordering and review of radiographic studies, review of telemetry data including pulse oximetry, re-evaluation of patient's condition and participation in multidisciplinary rounds.   I personally spent 56 minutes providing critical care not including any separately billable procedures.   Belva November, MD [facility] 11/13/23 8:45 AM

## 2023-11-09 NOTE — Progress Notes (Signed)
 OT Cancellation Note  Patient Details Name: Scott Hudson MRN: [id number] DOB: [birth year]   Cancelled Treatment:    Reason Eval/Treat Not Completed: Medical issues which prohibited therapy. Patient's level of consciousness (OT will sign off at this time.)  Aleksander Edmiston E Hadessah Grennan 10-25-23, 12:03 PM

## 2023-11-09 NOTE — Death Summary Note (Signed)
 DEATH SUMMARY   Patient Details  Name: Scott Hudson MRN: [id number] DOB: September 14, 1929  Admission/Discharge Information   Admit Date:  11/14/2023  Date of Death: 11/19/23  Time of Death: 16:10  Length of Stay: 5  Referring Physician: Corlis Honor BROCKS, MD   Reason(s) for Hospitalization   Acute Hypoxic Respiratory Failure  Diagnoses  Preliminary cause of death:  Secondary Diagnoses (including complications and co-morbidities):  Principal Problem:   Symptomatic anemia Active Problems:   Iron  deficiency anemia due to chronic blood loss   Diabetes mellitus type 2 in nonobese (HCC)   Hyperlipidemia   PAD (peripheral artery disease)   GERD (gastroesophageal reflux disease)   Essential hypertension   History of peptic ulcer   AKI (acute kidney injury)   Cardiac arrest (HCC)   Malnutrition of moderate degree   Brief Hospital Course (including significant findings, care, treatment, and services provided and events leading to death)   Scott Hudson is a 89 y.o. year old male who was admitted to the hospital on 2023/11/14 with chest discomfort, shortness of breath and anemia.  He suffered a cardiac arrest day of presentation resulting in 10 to 15 minutes of chest compressions and intubation and admission to the tensive care unit.  In the intensive care unit, he was treated for acute hypoxic respiratory failure and ARDS secondary to severe aspiration pneumonia.  Patient's respiratory status improved but his mental state did not and a brain MRI showed signs of severe anoxic brain injury.  Family members were clear that patient would not want to live in a vegetative state and they decided to transition his goals of care to comfort measures only.  Patient was compassionately extubated and passed away in the presence of his living family.    Pertinent Labs and Studies  Significant Diagnostic Studies MR BRAIN WO CONTRAST Result Date: 2023-11-19 EXAM: MR Brain without Intravenous  Contrast. CLINICAL HISTORY: cardiac arrest prognostication. Cardiac arrest. Eval for anoxic brain. TECHNIQUE: Magnetic resonance images of the brain without intravenous contrast in multiple planes. CONTRAST: Without. COMPARISON: CT from 10/18/2023. FINDINGS: BRAIN: Generalized age related cerebral atrophy. Patchy T2/FLAIR hyperintensity involving the periventricular and deep white matter, consistent with chronic small vessel disease, moderate in nature. Extensive patchy multifocal areas of restricted diffusion are seen involving the cortical and subcortical aspects of both cerebral hemispheres, patchy involvement of the deep gray nuclei including the bilateral basal ganglia and thalami, and patchy involvement of the pons, with additional multifocal ischemic changes involving the bilateral cerebellar hemispheres. Overall, the constellation of findings is consistent with a severe hypoperfusion injury with associated evolving acute to early subacute ischemic infarcts. No associated hemorrhage. Associated gyral swelling and edema without significant regional mass effect or midline shift. No extra-axial fluid collection. No cerebellar tonsillar ectopia. The central arterial and venous flow voids are patent. VENTRICLES: No hydrocephalus. ORBITS: The orbits are normal. SINUSES AND MASTOIDS: Mild scattered mucosal thickening present about the paranasal sinuses and maxillary sinuses. Small bilateral mastoid effusions noted. BONES: No acute fracture or focal osseous lesion. OTHER: Patient appears to be intubated. IMPRESSION: 1. Consultation of MRI findings compatible with severe hypoperfusion injury with evolving acute to early subacute ischemic infarcts involving the cortical and subcortical aspects of both cerebral hemispheres, deep gray nuclei, brainstem, and cerebellum. 2. Underlying moderate chronic microvascular ischemic disease. Electronically signed by: Morene Hoard MD Nov 19, 2023 03:23 AM EDT RP Workstation:  [id number]   DG Abd 1 View Result Date: 10/21/2023 CLINICAL DATA:  Orogastric tube placement. EXAM: ABDOMEN -  1 VIEW COMPARISON:  Earlier today, CT 10/17/2023 FINDINGS: Tip of the enteric tube in the left upper quadrant in the region of the proximal stomach, side-port is likely just beyond the gastroesophageal junction. Nonobstructive upper abdominal bowel gas pattern. Stable calcifications in the right abdomen that represent calcified lymph nodes on recent CT. IMPRESSION: Tip of the enteric tube in the left upper quadrant in the region of the proximal stomach, side-port likely just beyond the gastroesophageal junction. Electronically Signed   By: Andrea Gasman M.D.   On: 10/21/2023 16:34   DG Abd 1 View Result Date: 10/21/2023 CLINICAL DATA:  Check gastric catheter placement EXAM: ABDOMEN - 1 VIEW COMPARISON:  10/17/2023 FINDINGS: Gastric catheter is noted with the tip in the stomach. Proximal side port lies in the distal esophagus. This should be advanced deeper into the stomach. No free air is seen. IMPRESSION: Gastric catheter as described. This should be advanced deeper into the stomach. Electronically Signed   By: Oneil Devonshire M.D.   On: 10/21/2023 03:40   DG Chest Port 1 View Result Date: 10/20/2023 CLINICAL DATA:  Interstitial edema EXAM: PORTABLE CHEST 1 VIEW COMPARISON:  Chest radiograph dated 10/19/2023 FINDINGS: Lines/tubes: Endotracheal tube tip projects 2.1 cm above the carina. Enteric tube tip reaches the diaphragm and terminates below the field of view. Metal tipped esophageal temperature probe terminates over the upper thoracic esophagus. A portion of the probe is suspected to be coiled within the cervical esophagus. Lungs: Low lung volumes with bronchovascular crowding. Persistent diffuse interstitial and increased bibasilar patchy opacities. Pleura: Moderate bilateral pleural effusions.  No pneumothorax. Heart/mediastinum: Enlarged cardiomediastinal silhouette. Bones: No acute  osseous abnormality. IMPRESSION: 1. Persistent diffuse interstitial and increased bibasilar patchy opacities, likely a combination of pulmonary edema and atelectasis. 2. Moderate bilateral pleural effusions. 3. Metal tipped esophageal temperature probe terminates over the upper thoracic esophagus. A portion of the probe is suspected to be coiled within the cervical esophagus. Recommend repositioning. Electronically Signed   By: Limin  Xu M.D.   On: 10/20/2023 16:23   CT CHEST WO CONTRAST Result Date: 10/19/2023 CLINICAL DATA:  Hemoptysis EXAM: CT CHEST WITHOUT CONTRAST TECHNIQUE: Multidetector CT imaging of the chest was performed following the standard protocol without IV contrast. RADIATION DOSE REDUCTION: This exam was performed according to the departmental dose-optimization program which includes automated exposure control, adjustment of the mA and/or kV according to patient size and/or use of iterative reconstruction technique. COMPARISON:  Chest x-ray from earlier in the same day FINDINGS: Cardiovascular: Somewhat limited due to lack of IV contrast. Atherosclerotic calcifications of the thoracic aorta are noted. No aneurysmal dilatation is seen. Coronary calcifications are noted. The heart is mildly enlarged in size. Mediastinum/Nodes: Thoracic inlet is within normal limits. Endotracheal tube is noted in satisfactory position. Gastric catheter extends the stomach. No hilar or mediastinal adenopathy is noted. Lungs/Pleura: Lungs show no diffuse bilateral infiltrate particularly in the lower lobes but to a lesser degree in the upper lobes. This has increased in the interval from the prior plain film examination. No sizable nodules are seen. Upper Abdomen: Visualized upper abdomen shows vicarious excretion of contrast within the gallbladder. No other focal abnormality is seen. Splenic granulomas are seen. Musculoskeletal: Mildly displaced fractures of the second through fourth ribs bilaterally. No associated  complicating factors are noted. These are likely related to recent CPR IMPRESSION: Bilateral anterior rib fractures as described related to recent CPR. Diffuse infiltrate is noted bilaterally. This has increased in the interval from the prior plain film  examination. These changes may represent alveolar hemorrhage given the history of hemoptysis. Electronically Signed   By: Oneil Devonshire M.D.   On: 10/19/2023 23:10   DG Chest Port 1 View Result Date: 10/19/2023 EXAM: 1 VIEW(S) XRAY OF THE CHEST 10/19/2023 04:57:00 AM COMPARISON: 10/18/2023 CLINICAL HISTORY: Acute respiratory failure with hypoxia and hypercarbia (HCC) [id number]. Acute respiratory failure with hypoxia and hypercarbia. FINDINGS: LINES, TUBES AND DEVICES: Endotracheal tube in good position between clavicles and carina. Enteric tube terminates in stomach, side hole at gastric cardia level. LUNGS AND PLEURA: Improved lung volumes. Regressed widespread interstitial opacity since yesterday. Possible small pleural effusions, decreased. No pulmonary edema. No pneumothorax. HEART AND MEDIASTINUM: No acute abnormality of the cardiac and mediastinal silhouettes. BONES AND SOFT TISSUES: No acute osseous abnormality. UPPER ABDOMEN: Paucity of bowel gas. IMPRESSION: 1. Improved lung volumes with interval improvement of widespread interstitial opacities since yesterday. 2. Possible small pleural effusions, decreased. Electronically signed by: Waddell Calk MD 10/19/2023 05:38 AM EDT RP Workstation: [id number]   EEG adult Result Date: 10/18/2023 Michaela Aisha SQUIBB, MD     10/18/2023 10:06 PM History: 89 yo M with encephalopathy, eeg to rule out seizure as cause EEG Duration: 25 minutes Sedation: none Patient State: comatose Technique: This EEG was acquired with electrodes placed according to the International 10-20 electrode system (including Fp1, Fp2, F3, F4, C3, C4, P3, P4, O1, O2, T3, T4, T5, T6, A1, A2, Fz, Cz, Pz). The following electrodes were missing  or displaced: none. Background: The background consists of low voltage delta activity with some superimposed beta activity seen. There is no posterior dominant rhythm noted. There is prominent muscle artifact that obscures the frontal leads, but wihtin this limitation there was no definite epileptiform activity seen. Photic stimulation: Physiologic driving is not performed EEG Abnormalities: 1) diffuse irregular slow activity 2) Absent PDR Clinical Interpretation: This EEG is consistent with a generalized non-specific cerebral dysfunction(encephalopathy). There was no seizure or seizure predisposition recorded on this study. Please note that lack of epileptiform activity on EEG does not preclude the possibility of epilepsy. Aisha Michaela, MD [facility] If 7pm- 7am, please page neurology on call as listed in AMION.  ECHOCARDIOGRAM COMPLETE Result Date: 10/18/2023    ECHOCARDIOGRAM REPORT   Patient Name:   TARRON KROLAK Date of Exam: 10/18/2023 Medical Rec #:  [id number]         Height:       68.0 in Accession #:    [id number]        Weight:       163.0 lb Date of Birth:  February 26, 1929        BSA:          1.874 m Patient Age:    89 years          BP:           129/63 mmHg Patient Gender: M                 HR:           76 bpm. Exam Location:  [facility] Procedure: 2D Echo, Cardiac Doppler, Color Doppler and Intracardiac            Opacification Agent (Both Spectral and Color Flow Doppler were            utilized during procedure). Indications:     Cardiac arrest I46.9  History:         Patient has prior history of Echocardiogram examinations, most  recent 04/28/2018. Arrythmias:Cardiac Arrest.  Sonographer:     Rosina Dunk Referring Phys:  [id number] BRITTON L R[place] Diagnosing Phys: Marsa Dooms MD IMPRESSIONS  1. Left ventricular ejection fraction, by estimation, is 45 to 50%. The left ventricle has mildly decreased function. The left ventricle has no regional  wall motion abnormalities. Left ventricular diastolic parameters were normal.  2. Right ventricular systolic function is normal. The right ventricular size is normal.  3. The mitral valve is normal in structure. Mild to moderate mitral valve regurgitation. No evidence of mitral stenosis.  4. The aortic valve is normal in structure. Aortic valve regurgitation is mild. No aortic stenosis is present.  5. The inferior vena cava is normal in size with greater than 50% respiratory variability, suggesting right atrial pressure of 3 mmHg. FINDINGS  Left Ventricle: Left ventricular ejection fraction, by estimation, is 45 to 50%. The left ventricle has mildly decreased function. The left ventricle has no regional wall motion abnormalities. Definity contrast agent was given IV to delineate the left ventricular endocardial borders. Strain was performed and the global longitudinal strain is indeterminate. The left ventricular internal cavity size was normal in size. There is no left ventricular hypertrophy. Left ventricular diastolic parameters were normal. Right Ventricle: The right ventricular size is normal. No increase in right ventricular wall thickness. Right ventricular systolic function is normal. Left Atrium: Left atrial size was normal in size. Right Atrium: Right atrial size was normal in size. Pericardium: There is no evidence of pericardial effusion. Mitral Valve: The mitral valve is normal in structure. Mild to moderate mitral valve regurgitation. No evidence of mitral valve stenosis. MV peak gradient, 4.9 mmHg. The mean mitral valve gradient is 2.0 mmHg. Tricuspid Valve: The tricuspid valve is normal in structure. Tricuspid valve regurgitation is not demonstrated. No evidence of tricuspid stenosis. Aortic Valve: The aortic valve is normal in structure. Aortic valve regurgitation is mild. No aortic stenosis is present. Aortic valve mean gradient measures 3.0 mmHg. Aortic valve peak gradient measures 5.7 mmHg.  Aortic valve area, by VTI measures 2.62 cm. Pulmonic Valve: The pulmonic valve was normal in structure. Pulmonic valve regurgitation is not visualized. No evidence of pulmonic stenosis. Aorta: The aortic root is normal in size and structure. Venous: The inferior vena cava is normal in size with greater than 50% respiratory variability, suggesting right atrial pressure of 3 mmHg. IAS/Shunts: No atrial level shunt detected by color flow Doppler. Additional Comments: 3D was performed not requiring image post processing on an independent workstation and was indeterminate.  LEFT VENTRICLE PLAX 2D LVIDd:         4.90 cm     Diastology LVIDs:         3.10 cm     LV e' medial:    5.71 cm/s LV PW:         1.00 cm     LV E/e' medial:  17.7 LV IVS:        0.80 cm     LV e' lateral:   8.22 cm/s LVOT diam:     2.20 cm     LV E/e' lateral: 12.3 LV SV:         59 LV SV Index:   32 LVOT Area:     3.80 cm  LV Volumes (MOD) LV vol d, MOD A2C: 60.8 ml LV vol d, MOD A4C: 90.5 ml LV vol s, MOD A2C: 33.8 ml LV vol s, MOD A4C: 50.0 ml LV SV MOD A2C:  27.0 ml LV SV MOD A4C:     90.5 ml LV SV MOD BP:      37.1 ml RIGHT VENTRICLE             IVC RV Basal diam:  3.60 cm     IVC diam: 2.20 cm RV Mid diam:    2.60 cm RV S prime:     11.40 cm/s TAPSE (M-mode): 2.1 cm LEFT ATRIUM             Index        RIGHT ATRIUM           Index LA diam:        3.10 cm 1.65 cm/m   RA Area:     16.40 cm LA Vol (A2C):   65.8 ml 35.12 ml/m  RA Volume:   52.40 ml  27.97 ml/m LA Vol (A4C):   34.9 ml 18.63 ml/m LA Biplane Vol: 49.6 ml 26.47 ml/m  AORTIC VALVE                    PULMONIC VALVE AV Area (Vmax):    2.73 cm     PV Vmax:          0.80 m/s AV Area (Vmean):   2.63 cm     PV Vmean:         55.300 cm/s AV Area (VTI):     2.62 cm     PV VTI:           0.159 m AV Vmax:           119.50 cm/s  PV Peak grad:     2.5 mmHg AV Vmean:          79.150 cm/s  PV Mean grad:     1.0 mmHg AV VTI:            0.226 m      PR End Diast Vel: 6.35 msec AV Peak  Grad:      5.7 mmHg     RVOT Peak grad:   1 mmHg AV Mean Grad:      3.0 mmHg LVOT Vmax:         85.70 cm/s LVOT Vmean:        54.800 cm/s LVOT VTI:          0.156 m LVOT/AV VTI ratio: 0.69  AORTA Ao Root diam: 3.00 cm Ao Asc diam:  2.90 cm MITRAL VALVE                TRICUSPID VALVE MV Area (PHT): 3.91 cm     TR Peak grad:   42.2 mmHg MV Area VTI:   2.29 cm     TR Mean grad:   29.0 mmHg MV Peak grad:  4.9 mmHg     TR Vmax:        325.00 cm/s MV Mean grad:  2.0 mmHg     TR Vmean:       262.0 cm/s MV Vmax:       1.11 m/s MV Vmean:      64.1 cm/s    SHUNTS MV Decel Time: 194 msec     Systemic VTI:  0.16 m MR Peak grad: 104.4 mmHg    Systemic Diam: 2.20 cm MR Mean grad: 69.5 mmHg     Pulmonic VTI:  0.093 m MR Vmax:      511.00 cm/s MR Vmean:     397.5 cm/s MV E velocity:  101.00 cm/s MV A velocity: 81.80 cm/s MV E/A ratio:  1.23 Marsa Dooms MD Electronically signed by Marsa Dooms MD Signature Date/Time: 10/18/2023/2:56:10 PM    Final    US  Venous Img Lower Bilateral (DVT) Result Date: 10/18/2023 CLINICAL DATA:  Short of breath EXAM: BILATERAL LOWER EXTREMITY VENOUS DOPPLER ULTRASOUND TECHNIQUE: Gray-scale sonography with graded compression, as well as color Doppler and duplex ultrasound were performed to evaluate the lower extremity deep venous systems from the level of the common femoral vein and including the common femoral, femoral, profunda femoral, popliteal and calf veins including the posterior tibial, peroneal and gastrocnemius veins when visible. The superficial great saphenous vein was also interrogated. Spectral Doppler was utilized to evaluate flow at rest and with distal augmentation maneuvers in the common femoral, femoral and popliteal veins. COMPARISON:  None Available. FINDINGS: RIGHT LOWER EXTREMITY Common Femoral Vein: No evidence of thrombus. Normal compressibility, respiratory phasicity and response to augmentation. Saphenofemoral Junction: No evidence of thrombus. Normal  compressibility and flow on color Doppler imaging. Profunda Femoral Vein: No evidence of thrombus. Normal compressibility and flow on color Doppler imaging. Femoral Vein: No evidence of thrombus. Normal compressibility, respiratory phasicity and response to augmentation. Popliteal Vein: No evidence of thrombus. Normal compressibility, respiratory phasicity and response to augmentation. Calf Veins: No evidence of thrombus. Normal compressibility and flow on color Doppler imaging. Superficial Great Saphenous Vein: No evidence of thrombus. Normal compressibility. Venous Reflux:  None. Other Findings: Mildly complex fluid collection in the popliteal fossa measures 2.3 x 1.5 x 1.9 cm. Findings are consistent with a Baker's cysts. LEFT LOWER EXTREMITY Common Femoral Vein: No evidence of thrombus. Normal compressibility, respiratory phasicity and response to augmentation. Saphenofemoral Junction: No evidence of thrombus. Normal compressibility and flow on color Doppler imaging. Profunda Femoral Vein: No evidence of thrombus. Normal compressibility and flow on color Doppler imaging. Femoral Vein: No evidence of thrombus. Normal compressibility, respiratory phasicity and response to augmentation. Popliteal Vein: No evidence of thrombus. Normal compressibility, respiratory phasicity and response to augmentation. Calf Veins: No evidence of thrombus. Normal compressibility and flow on color Doppler imaging. Superficial Great Saphenous Vein: No evidence of thrombus. Normal compressibility. Venous Reflux:  None. Other Findings:  None. IMPRESSION: No evidence of deep venous thrombosis in either lower extremity. Small right-sided Baker's cyst. Electronically Signed   By: Wilkie Lent M.D.   On: 10/18/2023 11:19   DG Chest Port 1 View Result Date: 10/18/2023 EXAM: 1 VIEW(S) XRAY OF THE CHEST 10/18/2023 05:54:17 AM COMPARISON: Portable chest x ray yesterday and earlier. CLINICAL HISTORY: 89 year old male. Hypoxia. FINDINGS:  LINES, TUBES AND DEVICES: Endotracheal tube tip appears stable just above the carina stable visible in territory. Enteric tube now courses smoothly from the neck through the mediastinum and into the upper abdomen with tip not included. LUNGS AND PLEURA: Emphysema demonstrated on 2020 chest CT. Widespread coarse bilateral pulmonary interstitial opacity not significantly changed. Lung volumes and ventilation not significantly changed. Small bilateral pleural effusions are possible. No pneumothorax. No air bronchograms. HEART AND MEDIASTINUM: Mediastinal contours remain within normal limits. BONES AND SOFT TISSUES: No acute osseous abnormality. Paucity of bowel gas in the visible abdomen. IMPRESSION: 1. Chronic emphysema with Widespread coarse bilateral interstitial pulmonary opacities, not significantly changed. Consider pulmonary edema, bilateral pneumonia, ARDS. 2. ETT tip near the carina. Consider retraction of 1 cm for optimal placement. Enteric tube now satisfactory courses to the upper abdomen with tip not included. Electronically signed by: Helayne Hurst MD 10/18/2023 06:21 AM EDT RP  Workstation: [id number]   CT HEAD WO CONTRAST ( ) Result Date: 10/18/2023 EXAM: CT HEAD WITHOUT CONTRAST 10/18/2023 03:35:20 AM TECHNIQUE: CT of the head was performed without the administration of intravenous contrast. Automated exposure control, iterative reconstruction, and/or weight based adjustment of the mA/kV was utilized to reduce the radiation dose to as low as reasonably achievable. COMPARISON: CT head 04/29/2013 CLINICAL HISTORY: Mental status change, unknown cause. FINDINGS: BRAIN AND VENTRICLES: No acute hemorrhage. No evidence of acute infarct. No hydrocephalus. No extra-axial collection. No mass effect or midline shift. Patchy white matter hypodensities, compatible with chronic small vessel ischemic change. ORBITS: No acute abnormality. SINUSES: No acute abnormality. SOFT TISSUES AND SKULL: No acute soft tissue  abnormality. No skull fracture. IMPRESSION: 1. No acute intracranial abnormality. 2. Chronic microvascular ischemic change. Electronically signed by: Gilmore Molt MD 10/18/2023 03:41 AM EDT RP Workstation: [id number]   DG Chest Port 1 View Result Date: 10/17/2023 CLINICAL DATA:  Check endotracheal tube placement EXAM: PORTABLE CHEST 1 VIEW COMPARISON:  Film from earlier in the same day. FINDINGS: Tracheal tube is noted 2 cm above the carina. Nasogastric catheter is coiled within the pharynx with the tip at the thoracic inlet. This should withdrawn completely readvanced. Lungs are well aerated bilaterally. Increasing vascular congestion and interstitial edema is noted. The cardiac shadow is stable. No acute bony abnormality is noted. IMPRESSION: Endotracheal tube as described. Gastric catheter appears coiled within the pharynx. This should be withdrawn and readvanced. Worsening CHF. Electronically Signed   By: Oneil Devonshire M.D.   On: 10/17/2023 23:56   DG Abd 1 View Result Date: 10/17/2023 CLINICAL DATA:  Check gastric catheter placement EXAM: ABDOMEN - 1 VIEW COMPARISON:  None Available. FINDINGS: Gastric catheter has been withdrawn and readvanced. Tip lies within the stomach although the proximal side port lies in the distal esophagus. This could be advanced further into the stomach. No free air is seen. IMPRESSION: Gastric catheter as described. This should be advanced deeper into the stomach. Electronically Signed   By: Oneil Devonshire M.D.   On: 10/17/2023 23:55   CT ABDOMEN PELVIS W CONTRAST Result Date: 10/17/2023 CLINICAL DATA:  Acute abdominal pain and shortness of breath, initial encounter EXAM: CT ABDOMEN AND PELVIS WITH CONTRAST TECHNIQUE: Multidetector CT imaging of the abdomen and pelvis was performed using the standard protocol following bolus administration of intravenous contrast. RADIATION DOSE REDUCTION: This exam was performed according to the departmental dose-optimization program which  includes automated exposure control, adjustment of the mA and/or kV according to patient size and/or use of iterative reconstruction technique. CONTRAST:  80mL OMNIPAQUE  IOHEXOL  300 MG/ML  SOLN COMPARISON:  02/16/2018 FINDINGS: Lower chest: Lung bases demonstrate small pleural effusions with minimal basilar atelectasis. No focal confluent infiltrate is seen. Calcified nodes are noted consistent with prior granulomatous disease. Hepatobiliary: No focal liver abnormality is seen. No gallstones, gallbladder wall thickening, or biliary dilatation. Pancreas: Unremarkable. No pancreatic ductal dilatation or surrounding inflammatory changes. Spleen: Scattered calcified granulomas are noted. Adrenals/Urinary Tract: Adrenal glands are within normal limits. Kidneys demonstrate a normal enhancement pattern bilaterally. No obstructive changes are seen. The bladder is well distended. Stomach/Bowel: No obstructive or inflammatory changes of the colon are seen. The appendix is within normal limits. Small bowel and stomach are unremarkable. Vascular/Lymphatic: Atherosclerotic calcifications of the abdominal aorta are noted. Scattered mural thrombus is seen. This is most noted at the aortic hiatus however stable from prior exam. Reproductive: Prostate is unremarkable. Other: No abdominal wall hernia or abnormality. No abdominopelvic ascites. Musculoskeletal:  Degenerative changes of lumbar spine are noted. IMPRESSION: No acute abnormality is noted in the abdomen correspond with the given clinical history. Mild basilar atelectasis and small effusions. Changes of prior granulomatous disease. Electronically Signed   By: Oneil Devonshire M.D.   On: 10/17/2023 20:10   DG Chest 2 View Result Date: 10/17/2023 CLINICAL DATA:  Chest pain and shortness of breath EXAM: CHEST - 2 VIEW COMPARISON:  01/20/2019 FINDINGS: Shallow inspiration with elevation of the left hemidiaphragm. Mild cardiac enlargement with mild pulmonary vascular congestion.  Peribronchial thickening with bronchiectasis consistent with chronic bronchitic changes. Superimposed infiltration in the left lower lung with small left pleural effusion. This likely represents pneumonia. No pneumothorax. Calcification of the aorta. Degenerative changes in the spine and shoulders. IMPRESSION: 1. Cardiac enlargement with mild pulmonary vascular congestion. 2. Chronic bronchitic changes in the lungs. 3. Increasing infiltration in the left lung base likely representing pneumonia. Likely small left pleural effusion. Electronically Signed   By: Elsie Gravely M.D.   On: 10/17/2023 17:43    Microbiology Recent Results (from the past 240 hours)  MRSA Next Gen by PCR, Nasal     Status: None   Collection Time: 10/18/23  4:40 AM   Specimen: Nasal Mucosa; Nasal Swab  Result Value Ref Range Status   MRSA by PCR Next Gen NOT DETECTED NOT DETECTED Final    Comment: (NOTE) The GeneXpert MRSA Assay (FDA approved for NASAL specimens only), is one component of a comprehensive MRSA colonization surveillance program. It is not intended to diagnose MRSA infection nor to guide or monitor treatment for MRSA infections. Test performance is not FDA approved in patients less than 12 years old. Performed at [facility], [address] [place], [place] [address]   Resp panel by RT-PCR (RSV, Flu A&B, Covid) Anterior Nasal Swab     Status: None   Collection Time: 10/18/23  5:27 AM   Specimen: Anterior Nasal Swab  Result Value Ref Range Status   SARS Coronavirus 2 by RT PCR NEGATIVE NEGATIVE Final    Comment: (NOTE) SARS-CoV-2 target nucleic acids are NOT DETECTED.  The SARS-CoV-2 RNA is generally detectable in upper respiratory specimens during the acute phase of infection. The lowest concentration of SARS-CoV-2 viral copies this assay can detect is 138 copies/mL. A negative result does not preclude SARS-Cov-2 infection and should not be used as the sole basis for treatment or other  patient management decisions. A negative result may occur with  improper specimen collection/handling, submission of specimen other than nasopharyngeal swab, presence of viral mutation(s) within the areas targeted by this assay, and inadequate number of viral copies(<138 copies/mL). A negative result must be combined with clinical observations, patient history, and epidemiological information. The expected result is Negative.  Fact Sheet for Patients:  BloggerCourse.com  Fact Sheet for Healthcare Providers:  SeriousBroker.it  This test is no t yet approved or cleared by the [place]  FDA and  has been authorized for detection and/or diagnosis of SARS-CoV-2 by FDA under an Emergency Use Authorization (EUA). This EUA will remain  in effect (meaning this test can be used) for the duration of the COVID-19 declaration under Section 564(b)(1) of the Act, 21 U.S.C.section 360bbb-3(b)(1), unless the authorization is terminated  or revoked sooner.       Influenza A by PCR NEGATIVE NEGATIVE Final   Influenza B by PCR NEGATIVE NEGATIVE Final    Comment: (NOTE) The Xpert Xpress SARS-CoV-2/FLU/RSV plus assay is intended as an aid in the diagnosis of  influenza from Nasopharyngeal swab specimens and should not be used as a sole basis for treatment. Nasal washings and aspirates are unacceptable for Xpert Xpress SARS-CoV-2/FLU/RSV testing.  Fact Sheet for Patients: BloggerCourse.com  Fact Sheet for Healthcare Providers: SeriousBroker.it  This test is not yet approved or cleared by the [place]  FDA and has been authorized for detection and/or diagnosis of SARS-CoV-2 by FDA under an Emergency Use Authorization (EUA). This EUA will remain in effect (meaning this test can be used) for the duration of the COVID-19 declaration under Section 564(b)(1) of the Act, 21 U.S.C. section  360bbb-3(b)(1), unless the authorization is terminated or revoked.     Resp Syncytial Virus by PCR NEGATIVE NEGATIVE Final    Comment: (NOTE) Fact Sheet for Patients: BloggerCourse.com  Fact Sheet for Healthcare Providers: SeriousBroker.it  This test is not yet approved or cleared by the [place]  FDA and has been authorized for detection and/or diagnosis of SARS-CoV-2 by FDA under an Emergency Use Authorization (EUA). This EUA will remain in effect (meaning this test can be used) for the duration of the COVID-19 declaration under Section 564(b)(1) of the Act, 21 U.S.C. section 360bbb-3(b)(1), unless the authorization is terminated or revoked.  Performed at [facility], [address] [place], [place] [address]   Respiratory (~20 pathogens) panel by PCR     Status: None   Collection Time: 10/18/23  5:27 AM   Specimen: Nasopharyngeal Swab; Respiratory  Result Value Ref Range Status   Adenovirus NOT DETECTED NOT DETECTED Final   Coronavirus 229E NOT DETECTED NOT DETECTED Final    Comment: (NOTE) The Coronavirus on the Respiratory Panel, DOES NOT test for the novel  Coronavirus (2019 nCoV)    Coronavirus HKU1 NOT DETECTED NOT DETECTED Final   Coronavirus NL63 NOT DETECTED NOT DETECTED Final   Coronavirus OC43 NOT DETECTED NOT DETECTED Final   Metapneumovirus NOT DETECTED NOT DETECTED Final   Rhinovirus / Enterovirus NOT DETECTED NOT DETECTED Final   Influenza A NOT DETECTED NOT DETECTED Final   Influenza B NOT DETECTED NOT DETECTED Final   Parainfluenza Virus 1 NOT DETECTED NOT DETECTED Final   Parainfluenza Virus 2 NOT DETECTED NOT DETECTED Final   Parainfluenza Virus 3 NOT DETECTED NOT DETECTED Final   Parainfluenza Virus 4 NOT DETECTED NOT DETECTED Final   Respiratory Syncytial Virus NOT DETECTED NOT DETECTED Final   Bordetella pertussis NOT DETECTED NOT DETECTED Final   Bordetella Parapertussis NOT  DETECTED NOT DETECTED Final   Chlamydophila pneumoniae NOT DETECTED NOT DETECTED Final   Mycoplasma pneumoniae NOT DETECTED NOT DETECTED Final    Comment: Performed at [facility], [address] [place], [place] [address]  Culture, Respiratory w Gram Stain     Status: None   Collection Time: 10/18/23  8:14 AM   Specimen: Tracheal Aspirate; Respiratory  Result Value Ref Range Status   Specimen Description   Final    TRACHEAL ASPIRATE Performed at [facility], [address] [place], [place] [address]    Special Requests   Final    NONE Performed at [facility], [address] [place], [place] [address]    Gram Stain   Final    RARE WBC PRESENT, PREDOMINANTLY PMN NO ORGANISMS SEEN    Culture   Final    RARE Normal respiratory flora-no Staph aureus or Pseudomonas seen Performed at [facility], [address], [place], [place] [address]    Report Status November 18, 2023 FINAL  Final  Culture, blood (Routine X 2) w Reflex to ID Panel     Status: None (Preliminary result)   Collection Time: 10/18/23  8:27 AM   Specimen: BLOOD  Result Value Ref Range Status   Specimen Description BLOOD BLOOD LEFT HAND  Final   Special Requests   Final    BOTTLES DRAWN AEROBIC AND ANAEROBIC Blood Culture adequate volume   Culture   Final    NO GROWTH 4 DAYS Performed at [facility], [address], [place], [place] [address]    Report Status PENDING  Incomplete  Culture, blood (Routine X 2) w Reflex to ID Panel     Status: None (Preliminary result)   Collection Time: 10/18/23 11:40 AM   Specimen: BLOOD  Result Value Ref Range Status   Specimen Description BLOOD BLOOD RIGHT ARM  Final   Special Requests   Final    BOTTLES DRAWN AEROBIC AND ANAEROBIC Blood Culture adequate volume   Culture   Final    NO GROWTH 4 DAYS Performed at [facility], [address] [place], [place] [address]    Report Status PENDING  Incomplete  Culture,  Respiratory w Gram Stain     Status: None   Collection Time: 10/20/23  6:38 AM   Specimen: Tracheal Aspirate; Respiratory  Result Value Ref Range Status   Specimen Description   Final    TRACHEAL ASPIRATE Performed at [facility], [address] [place], [place] [address]    Special Requests   Final    NONE Performed at [facility], [address] [place], [place] [address]    Gram Stain FEW WBC SEEN RARE YEAST   Final   Culture   Final    FEW CANDIDA ALBICANS NO STAPHYLOCOCCUS AUREUS ISOLATED No Pseudomonas species isolated Performed at [facility], [address], [place], [place] [address]    Report Status 21-Nov-2023 FINAL  Final  Culture, BAL-quantitative w Gram Stain     Status: Abnormal   Collection Time: 10/20/23  9:15 AM   Specimen: Bronchoalveolar Lavage; Respiratory  Result Value Ref Range Status   Specimen Description   Final    BRONCHIAL ALVEOLAR LAVAGE Performed at [facility], [address], [place], [place] [address]    Special Requests   Final    NONE Performed at [facility], [address] [place], [place] [address]    Gram Stain   Final    ABUNDANT WBC PRESENT, PREDOMINANTLY PMN NO ORGANISMS SEEN NO SQUAMOUS EPITHELIAL CELLS SEEN    Culture (A)  Final    <10,000 COLONIES/mL Normal respiratory flora-no Staph aureus or Pseudomonas seen Performed at [facility], [address] [place], [place] [address]    Report Status 11/21/23 FINAL  Final    Lab Basic Metabolic Panel: Recent Labs  Lab 10/18/23 0029 10/18/23 0431 10/18/23 1140 10/19/23 0356 10/20/23 0453 10/21/23 0501 11-21-23 0503  NA 140 142 144 141 144 147* 147*  K 4.5 4.6 4.7 5.1 3.9 3.5 3.5  CL 109 107 105 101 101 106 110  CO2 14* 18* 24 24 27 30 28   GLUCOSE 241* 208* 147* 170* 116* 88 123*  BUN 30* 33* 35* 48* 57* 56* 46*  CREATININE 2.31* 2.21* 2.40* 2.68* 2.65* 2.25* 1.72*  CALCIUM  8.4* 8.5* 8.1* 7.5* 7.4* 7.5*  7.8*  MG 3.2* 3.0*  --   --  2.8*  --  3.5*  PHOS 8.5* 7.3*  --  6.4*  6.8* 4.4 3.4   Liver Function Tests: Recent Labs  Lab 10/17/23 1639 10/18/23 0029 10/18/23 0431 10/19/23 0356 10/20/23 0453 10/21/23 0501 11/03/23 0503  AST 32 125* 146* 103*  --   --   --   ALT 17 110* 116* 90*  --   --   --   ALKPHOS 56 56 60 44  --   --   --   BILITOT 0.4 0.5 0.7 0.6  --   --   --   PROT 6.5 5.9* 6.2* 5.7*  --   --   --   ALBUMIN 3.6 3.1* 3.3* 3.0*  3.0* 2.9* 2.8* 2.6*   Recent Labs  Lab 10/17/23 1639 10/18/23 0431  LIPASE 55* 53*   Recent Labs  Lab 10/18/23 0431  AMMONIA <13   CBC: Recent Labs  Lab 10/18/23 0029 10/18/23 1206 10/19/23 0356 10/19/23 0848 10/20/23 0453 10/20/23 1127 10/21/23 0501 10/21/23 1139 10/21/23 1830 11-03-2023 0030 11-03-23 0503  WBC 10.7*  --  15.4*  --  12.9*  --  10.3  --   --   --  8.4  HGB 6.0*   < > 7.9*   < > 7.2*   < > 7.6* 7.5* 7.9* 8.0* 7.7*  HCT 21.2*   < > 24.9*   < > 23.1*   < > 24.9* 25.2* 25.4* 25.9* 25.9*  MCV 86.9  --  81.9  --  81.6  --  83.6  --   --   --  84.9  PLT 204  --  121*  --  114*  --  104*  --   --   --  95*   < > = values in this interval not displayed.   Cardiac Enzymes: No results for input(s): CKTOTAL, CKMB, CKMBINDEX, TROPONINI in the last 168 hours. Sepsis Labs: Recent Labs  Lab 10/18/23 0029 10/18/23 0431 10/18/23 0827 10/18/23 1140 10/19/23 0356 10/20/23 0453 10/21/23 0501 Nov 03, 2023 0503  PROCALCITON  --  0.82  --   --  7.07  --   --   --   WBC 10.7*  --   --   --  15.4* 12.9* 10.3 8.4  LATICACIDVEN >9.0* 8.7* 6.3* 2.7*  --   --   --   --       Chanon Loney November 03, 2023, 4:20 PM

## 2023-11-09 DEATH — deceased
